# Patient Record
Sex: Male | Born: 1935 | Race: White | Hispanic: No | Marital: Married | Smoking: Former smoker
Health system: Southern US, Community
[De-identification: ages and names within clinical notes are randomized; demographics above are authoritative.]

## PROBLEM LIST (undated history)

## (undated) DIAGNOSIS — I2510 Atherosclerotic heart disease of native coronary artery without angina pectoris: Secondary | ICD-10-CM

## (undated) DIAGNOSIS — J45909 Unspecified asthma, uncomplicated: Secondary | ICD-10-CM

## (undated) DIAGNOSIS — I10 Essential (primary) hypertension: Secondary | ICD-10-CM

## (undated) DIAGNOSIS — N189 Chronic kidney disease, unspecified: Secondary | ICD-10-CM

## (undated) DIAGNOSIS — I4891 Unspecified atrial fibrillation: Secondary | ICD-10-CM

## (undated) DIAGNOSIS — C801 Malignant (primary) neoplasm, unspecified: Secondary | ICD-10-CM

## (undated) DIAGNOSIS — I509 Heart failure, unspecified: Secondary | ICD-10-CM

## (undated) DIAGNOSIS — J449 Chronic obstructive pulmonary disease, unspecified: Secondary | ICD-10-CM

## (undated) DIAGNOSIS — I442 Atrioventricular block, complete: Secondary | ICD-10-CM

## (undated) DIAGNOSIS — I350 Nonrheumatic aortic (valve) stenosis: Secondary | ICD-10-CM

## (undated) DIAGNOSIS — M109 Gout, unspecified: Secondary | ICD-10-CM

## (undated) DIAGNOSIS — Z952 Presence of prosthetic heart valve: Secondary | ICD-10-CM

## (undated) DIAGNOSIS — E7800 Pure hypercholesterolemia, unspecified: Secondary | ICD-10-CM

## (undated) HISTORY — PX: AORTIC VALVE SURGERY: SHX549

## (undated) HISTORY — PX: OTHER SURGICAL HISTORY: SHX169

## (undated) HISTORY — PX: PACEMAKER INSERTION: SHX728

## (undated) HISTORY — PX: CORONARY ARTERY BYPASS GRAFT: SHX141

---

## 2003-10-25 ENCOUNTER — Other Ambulatory Visit: Payer: Self-pay

## 2004-03-16 ENCOUNTER — Other Ambulatory Visit: Payer: Self-pay

## 2004-03-16 ENCOUNTER — Emergency Department: Payer: Self-pay | Admitting: Emergency Medicine

## 2004-03-20 ENCOUNTER — Other Ambulatory Visit: Payer: Self-pay

## 2004-03-20 ENCOUNTER — Emergency Department: Payer: Self-pay | Admitting: Emergency Medicine

## 2008-02-24 ENCOUNTER — Inpatient Hospital Stay: Payer: Self-pay | Admitting: Cardiology

## 2008-05-04 ENCOUNTER — Inpatient Hospital Stay: Payer: Self-pay | Admitting: Internal Medicine

## 2008-07-01 ENCOUNTER — Encounter: Payer: Self-pay | Admitting: Cardiology

## 2008-07-22 ENCOUNTER — Encounter: Payer: Self-pay | Admitting: Cardiology

## 2008-11-19 ENCOUNTER — Ambulatory Visit: Payer: Self-pay | Admitting: Cardiology

## 2009-04-10 ENCOUNTER — Emergency Department: Payer: Self-pay | Admitting: Emergency Medicine

## 2010-11-02 ENCOUNTER — Ambulatory Visit: Payer: Self-pay | Admitting: Urology

## 2010-11-08 ENCOUNTER — Ambulatory Visit: Payer: Self-pay | Admitting: Urology

## 2010-11-09 LAB — PATHOLOGY REPORT

## 2013-02-06 LAB — CBC WITH DIFFERENTIAL/PLATELET
Basophil #: 0 10*3/uL (ref 0.0–0.1)
Basophil %: 0.3 %
Eosinophil %: 4 %
HCT: 35.1 % — ABNORMAL LOW (ref 40.0–52.0)
Lymphocyte #: 0.9 10*3/uL — ABNORMAL LOW (ref 1.0–3.6)
Lymphocyte %: 9.3 %
Monocyte #: 0.8 x10 3/mm (ref 0.2–1.0)
Monocyte %: 8.4 %
Neutrophil %: 78 %

## 2013-02-06 LAB — BASIC METABOLIC PANEL
Calcium, Total: 8.1 mg/dL — ABNORMAL LOW (ref 8.5–10.1)
Co2: 24 mmol/L (ref 21–32)
EGFR (Non-African Amer.): 33 — ABNORMAL LOW
Potassium: 4 mmol/L (ref 3.5–5.1)

## 2013-02-06 LAB — TROPONIN I: Troponin-I: <0.02 ng/mL

## 2013-02-07 ENCOUNTER — Inpatient Hospital Stay: Payer: Self-pay | Admitting: Internal Medicine

## 2013-02-07 DIAGNOSIS — R55 Syncope and collapse: Secondary | ICD-10-CM

## 2013-02-07 LAB — URINALYSIS, COMPLETE
Blood: NEGATIVE
Glucose,UR: NEGATIVE mg/dL (ref 0–75)
Ketone: NEGATIVE
Leukocyte Esterase: NEGATIVE
Nitrite: NEGATIVE
Ph: 5 (ref 4.5–8.0)
RBC,UR: 1 /HPF (ref 0–5)
Specific Gravity: 1.018 (ref 1.003–1.030)
Squamous Epithelial: <1

## 2013-02-07 LAB — BASIC METABOLIC PANEL
Anion Gap: 7 (ref 7–16)
BUN: 26 mg/dL — ABNORMAL HIGH (ref 7–18)
Calcium, Total: 7.6 mg/dL — ABNORMAL LOW (ref 8.5–10.1)
Chloride: 109 mmol/L — ABNORMAL HIGH (ref 98–107)
Co2: 24 mmol/L (ref 21–32)
Creatinine: 1.58 mg/dL — ABNORMAL HIGH (ref 0.60–1.30)
EGFR (African American): 43 — ABNORMAL LOW
Glucose: 139 mg/dL — ABNORMAL HIGH (ref 65–99)
Osmolality: 286 (ref 275–301)
Potassium: 4 mmol/L (ref 3.5–5.1)

## 2013-02-07 LAB — CK TOTAL AND CKMB (NOT AT ARMC)
CK, Total: 61 U/L (ref 35–232)
CK-MB: 1.3 ng/mL (ref 0.5–3.6)

## 2013-02-07 LAB — PLATELET COUNT: Platelet: 115 10*3/uL — ABNORMAL LOW (ref 150–440)

## 2013-06-04 ENCOUNTER — Encounter: Payer: Self-pay | Admitting: Cardiology

## 2013-06-21 ENCOUNTER — Encounter: Payer: Self-pay | Admitting: Cardiology

## 2013-07-22 ENCOUNTER — Encounter: Payer: Self-pay | Admitting: Cardiology

## 2013-08-21 ENCOUNTER — Encounter: Payer: Self-pay | Admitting: Cardiology

## 2013-10-18 ENCOUNTER — Emergency Department: Payer: Self-pay | Admitting: Emergency Medicine

## 2013-10-18 LAB — BASIC METABOLIC PANEL
Anion Gap: 6 — ABNORMAL LOW (ref 7–16)
BUN: 24 mg/dL — AB (ref 7–18)
Calcium, Total: 8.9 mg/dL (ref 8.5–10.1)
Chloride: 107 mmol/L (ref 98–107)
Co2: 27 mmol/L (ref 21–32)
Creatinine: 1.28 mg/dL (ref 0.60–1.30)
GFR CALC AF AMER: 56 — AB
GFR CALC NON AF AMER: 48 — AB
Glucose: 118 mg/dL — ABNORMAL HIGH (ref 65–99)
OSMOLALITY: 285 (ref 275–301)
Potassium: 3.9 mmol/L (ref 3.5–5.1)
SODIUM: 140 mmol/L (ref 136–145)

## 2013-10-18 LAB — CBC
HCT: 45.1 % (ref 40.0–52.0)
HGB: 15.1 g/dL (ref 13.0–18.0)
MCH: 30.2 pg (ref 26.0–34.0)
MCHC: 33.5 g/dL (ref 32.0–36.0)
MCV: 90 fL (ref 80–100)
Platelet: 151 10*3/uL (ref 150–440)
RBC: 4.99 10*6/uL (ref 4.40–5.90)
RDW: 15.5 % — AB (ref 11.5–14.5)
WBC: 7.5 10*3/uL (ref 3.8–10.6)

## 2013-10-18 LAB — TROPONIN I

## 2014-08-13 NOTE — Consult Note (Signed)
PATIENT NAME:  Larry Alexander, Larry Alexander MR#:  [id number] DATE OF BIRTH:  1918-04-04  CARDIOLOGY CONSULTATION   DATE OF CONSULTATION:  02/07/2013  CONSULTING PHYSICIAN:  Isaias Cowman, MD  PRIMARY CARE PHYSICIAN: Youlanda Roys. Lovie Macadamia, MD  CARDIOLOGIST: Javier Docker. Ubaldo Glassing, MD  CHIEF COMPLAINT: I passed out.   HISTORY OF PRESENT ILLNESS: The patient is a 81 year old gentleman with known coronary artery disease, status post bypass graft surgery, and severe aortic stenosis, who was admitted following a syncopal episode. The patient recently underwent cardiac catheterization at [facility] which revealed patent bypass grafts and severe aortic stenosis. The patient is being discussed for potential CoreValve TAVR procedure at [facility]. The patient was at home last night when he slumped in the shower, found by his son. EMS was called. The patient was admitted to telemetry, where he has had intermittent episodes of high-grade AV block, the longest 10 seconds. The patient has remained hemodynamically stable following the hospitalization. He currently denies chest pain or shortness of breath.   PAST MEDICAL HISTORY:  1. Non-ST elevation myocardial infarction in 2010 with eventual bypass graft surgery at [facility].  2. Severe aortic stenosis.  3. Hypertension.  4. Hyperlipidemia.  5. Chronic kidney disease.   MEDICATIONS ON ADMISSION:  1. Metoprolol succinate 12.5 mg daily.  2. Losartan 100 mg daily. 3. Furosemide 20 mg daily.  4. Aspirin 81 mg daily.  5. Amlodipine 5 mg daily.  6. Tamsulosin 0.4 mg capsule daily.  7. Albuterol inhaler 2 puffs q.6 p.r.n.   SOCIAL HISTORY: The patient currently lives with his son and daughter-in-law. He quit tobacco abuse in 1985.   FAMILY HISTORY: No immediate family history of myocardial infarction or coronary artery disease.   REVIEW OF SYSTEMS:  CONSTITUTIONAL: No fever or chills.  EYES: No blurry vision.  EARS: No hearing loss.  RESPIRATORY: No shortness of breath.   CARDIOVASCULAR: No chest pain. Syncope as described above.  GASTROINTESTINAL: No nausea, vomiting or diarrhea.  GENITOURINARY: No dysuria or hematuria.  ENDOCRINE: No polyuria or polydipsia.  MUSCULOSKELETAL: No arthralgias or myalgias.  NEUROLOGICAL: The patient is alert and oriented x3. Motor and sensory both grossly intact.   PHYSICAL EXAMINATION:  VITAL SIGNS: Blood pressure 112/57, pulse 75, respirations 18, temperature 97.6, pulse oximetry 95%.  HEENT: Pupils equally reactive to light and accommodation.  NECK: Supple without thyromegaly.  LUNGS: Clear.  CARDIOVASCULAR: Normal JVP. Normal PMI. Regular rate and rhythm. Normal S1, S2. Grade 2/6 crescendo-decrescendo murmur.  ABDOMEN: Soft and nontender without hepatosplenomegaly.  EXTREMITIES: No cyanosis, clubbing or edema. Pulses were intact bilaterally.  MUSCULOSKELETAL: Normal muscle tone.  NEUROLOGIC: The patient is alert and oriented x3. Motor and sensory both grossly intact.   IMPRESSION: A 81 year old gentleman with known coronary artery disease, apparently with patent bypass grafts, with severe aortic stenosis, being considered for CoreValve at [facility], who presents with syncope, found to have episodes of high-grade AV block, likely the etiology of his syncope on only low-dose metoprolol.   RECOMMENDATIONS:  1. Agree with current therapy.  2. Discussed with Dr. Jamse Arn at [facility]. Will transfer for permanent pacemaker implantation and will likely accelerate CoreValve procedure.   ____________________________ Isaias Cowman, MD ap:lb D: 02/07/2013 10:51:05 ET T: 02/07/2013 11:17:13 ET JOB#: [id number]  cc: Isaias Cowman, MD, <Dictator> Isaias Cowman MD ELECTRONICALLY SIGNED 03/11/2013 9:29

## 2014-08-13 NOTE — H&P (Signed)
PATIENT NAME:  Larry Alexander, GOWIN MR#:  [id number] DATE OF BIRTH:  1917/12/13  DATE OF ADMISSION:  02/07/2013  REFERRING PHYSICIAN:  Dr. Mariea Clonts.  PRIMARY CARE PHYSICIAN:  Dr. Juluis Pitch.   CHIEF COMPLAINT: Syncope.  HISTORY OF PRESENT ILLNESS: The patient is a 81 year old Caucasian male with past medical history of hypertension, hyperlipidemia, aortic valve stenosis, chronic kidney disease, benign prostatic hypertrophy who was brought into the ER by his son. The patient was complaining of dizziness at noon yesterday and while taking a shower he was syncopized in the evening. According to the son, the patient was sitting in the shower and completely lost his consciousness. No traumas or injuries were noticed. According to the son, the patient might have passed out at least for 5 minutes. The patient was brought out of the shower by the son. He was laid on the bed and EMS was called. The patient sees two cardiologists and  he was seen by him just a 3 to 4 days ago. The patient was diagnosed with severe aortic stenosis and he is getting evaluated by a cardiothoracic surgeon regarding possible surgery considering his elderly age. The patient had a CAT scan of the chest done on 10/15 and a cardiac catheter was done on 10/14 which was normal without any blockages as reported by the son. The patient had regained his consciousness in approximately 5 minutes and brought into the ER. As the syncope seemed to be from severe aortic valve stenosis, a CAT scan of the head was not done. The patient denies any chest pain or shortness of breath.  The hospitalist team was called to admit the patient. The patient sees Dr. Ubaldo Glassing also as an outpatient. The patient denies any chest pain, shortness of breath, orthopnea, or paroxysmal nocturnal dyspnea.   PAST MEDICAL HISTORY: Coronary artery disease with a NSTEMI in the year 2010, hyperlipidemia, benign prostatic hypertrophy, hypertension, chronic kidney disease.   PAST SURGICAL  HISTORY: Recent history of cardiac catheter which was normal. This was done on 10/14 at [facility] cardiology.   ALLERGIES:  No known drug allergies.   PSYCHOSOCIAL HISTORY: Lives at home. Son and daughter-in-law live with him. He quit smoking in 1985. No history of alcohol or illicit drug usage.   FAMILY HISTORY: Positive for hypertension, congestive heart failure in his dad.   HOME MEDICATION:  Tamsulosin 0.4 mg 1 capsule p.o. daily, metoprolol succinate 12.5 mg once daily, meclizine 20 mg 2 times a day, losartan 100 mg once daily, furosemide 20 mg 1 tablet once daily, finasteride 5 mg once daily, atorvastatin 20 mg 1 tablet once daily, aspirin 81 mg once daily, amlodipine 5 mg once daily, albuterol 2 puffs inhalation every 6 hours as needed.  REVIEW OF SYSTEMS: CONSTITUTIONAL: Denies any fever or fatigue.  EYES: Denies blurry vision.  EARS, NOSE, THROAT: No epistaxis or discharge.   RESPIRATION: Denies cough or COPD.  CARDIOVASCULAR: No chest pain or palpation, but complaining of dizziness.  GASTROINTESTINAL:  Denies nausea, vomiting, diarrhea.  GENITOURINARY: No dysuria or hematuria.  ENDOCRINE: Denies polyuria, nocturia, thyroid problems.   HEMATOLOGIC AND LYMPHATIC:  No anemia, easy bruising or bleeding.  INTEGUMENT:  No acne, rash or lesions.  MUSCULOSKELETAL: No joint pain in the neck and back. Denies gout.  NEUROLOGIC: No vertigo, ataxia. The patient is hard of hearing.  PSYCHIATRIC: Denies ADD or OCD  PHYSICAL EXAMINATION:   VITAL SIGNS:  Temperature 97.4, pulse 71, respirations 20, blood pressure 101/54, pulse ox 98%. GENERAL:  The patient  is not in acute distress. Moderately built and nourished.  HEENT: Normocephalic, atraumatic. Pupils are equally reacting to light and accommodation. No scleral icterus. No conjunctival injection. No sinus tenderness. Dry mucous membranes.  NECK: Supple. No JVD. No thyromegaly.  LUNGS: Clear to auscultation bilaterally. No accessory muscle usage.  No anterior chest wall tenderness. No peripheral edema.  CARDIAC: S1, S2 normal. Regular rate and rhythm. Positive ejection systolic murmur, probably from severe aortic valve stenosis.   GASTROINTESTINAL:  Soft. Bowel sounds are positive in all 4 quadrants. Nontender, nondistended. No hepatosplenomegaly.  NEUROLOGIC:  Awake, alert, oriented x 2.  EXTREMITIES: No edema. No cyanosis. No clubbing. No peripheral edema.  MUSCULOSKELETAL: No joint effusion, tenderness, erythema. VASCULAR:  1+ dorsalis pedis and posterior tibialis pulses.   LABORATORY AND IMAGING STUDIES: CK total is 58, CPK-MB 1.2, troponin less than 0.02. WBC 9.9, hemoglobin is 12.3, hematocrit 35.1, platelets 124. Urinalysis yellow in color, clear in appearance, leukocyte esterase and nitrites are negative. Glucose 139, BUN 27, creatinine 1.75, sodium 139, potassium 4.0, chloride 106, CO2 24, anion gap 9. Serum osmolality 285, calcium 8.1.  12 lead EKG: Normal sinus rhythm with nonspecific ST-T wave changes.   ASSESSMENT AND PLAN: A 81 year old Caucasian male brought into the ER after he was syncopized at home for approximately 5 minutes.  He will be admitted with following assessment and plan.  1.  Syncope, probably from cardiogenic with a severe aortic valve stenosis and dehydration. The patient will be admitted to telemetry, cardiology consult is placed with Dr. Ubaldo Glassing in view of severe aortic valve stenosis. Cycle cardiac biomarkers. We will provide him IV fluids in view of dehydration.  We will obtain echocardiogram and carotid Doppler. CT head is not done as the  syncope seemed to be from cardiogenic syncope as well as dehydration. Recent cardiac catheter done on 10/14 was normal. We will check orthostatics. We will provide him aspirin and statin.  Hold hypertension medicines in view of low blood pressure.  2.  Dehydration. Will provide IV fluids.  3.  Chronic renal insufficiency.  We will monitor renal function closely and gentle  hydration with IV fluids in view of dehydration.  4.  Benign prostatic hypertrophy. Resume his home medication.  5.  Hyperlipidemia. Continue statin.  6.  Hypertension. In view of low blood pressure, we will hold off on the blood pressure medications.  7.  We will provide him gastrointestinal and deep vein thrombosis prophylaxis.  8.  He is FULL CODE. Power of attorney is his 2 sons.   Diagnoses and plan of care were discussed in detail with the patient and his son at bedside. They verbalized understanding of the plan.   Total time spent on admission is 45 minutes.   ____________________________ Nicholes Mango, MD ag:dp D: 02/07/2013 08:32:29 ET T: 02/07/2013 10:00:00 ET JOB#: [id number]  cc: Nicholes Mango, MD, <Dictator> Nicholes Mango MD ELECTRONICALLY SIGNED 02/20/2013 1:48

## 2014-08-13 NOTE — Discharge Summary (Signed)
PATIENT NAME:  Larry Alexander, PENTECOST MR#:  [id number] DATE OF BIRTH:  11-Nov-1917  DATE OF ADMISSION:  02/07/2013 DATE OF DISCHARGE:  02/07/2013  ADMITTING DIAGNOSIS: Syncope.  DISCHARGE DIAGNOSES:   1.  Syncope with high-degree atrioventricular block.   2.  Hypotension.  3.  Rash, likely drug eruption, suspected Lasix versus intravenous dye. 4.  History of aortic stenosis. 5.  Coronary artery disease, status post coronary artery bypass grafting. 6.  Chronic kidney disease. 7.  Hyperlipidemia. 8.  Benign prostatic hypertrophy. 9.  Chronic obstructive pulmonary disease. 10.  Possible suspected hemodynamically significant right internal carotid stenosis, possible 60% in the right carotid bulb, as well as borderline increased velocities in the left internal carotid.   CONSULTANTS: Dr. Saralyn Pilar.   RADIOLOGIC STUDIES: Chest PA and lateral, 02/06/2013, showed COPD with coronary artery bypass grafting changes, no acute abnormalities. CT scan of head without contrast, 02/07/2013, showed chronic changes, no acute intracranial abnormalities. Carotid ultrasound/bilateral Doppler ultrasound, 02/07/2013, revealed atherosclerotic disease. This appears to be possibly hemodynamically significant on the right with borderline increased velocities in the left internal carotid. Vascular interventional surgical consultation was recommended.   The patient does a 81 year old Caucasian male with past medical history significant for history of coronary artery disease, who presented to the hospital on 02/07/2013, just recently discharged from [facility] where he had cardiac catheterization as well as CT scan of the heart done and was evaluated for aortic valve stenosis repair via CoreValve TAVR procedure. Presented to the hospital with complaints of syncopal episode. Please refer to Dr. Rinaldo Ratel admission note dictated on 02/07/2013. On arrival to the hospital, the patient was noted to be somewhat hypotensive  with systolic blood pressure in the 100s range. Otherwise, his oxygen saturation, temperature, as well as pulse were within normal limits. The patient was just recently reinitiated back on Norvasc as well as diuretic/Lasix. He was admitted to the hospital for further evaluation and while in [facility], he was noted to have intermittent episodes of high-grade AV block. Consultation with Dr. Saralyn Pilar was obtained. Dr. Saralyn Pilar felt that the patient's syncopal episode was very likely due to high-grade AV block, etiology of which was very likely due to low-dose metoprolol. Dr. Saralyn Pilar discussed the patient's case with Dr. Jamse Arn at [facility] and the patient is being transferred for permanent pacemaker implantation, which will also likely accelerate CoreValve procedure.  The patient's vital signs on the day of discharge: Temperature was 97.4. Pulse was 79. Respiratory rate was 18, blood pressure 138/64. Saturation was 96% on 2 liters of oxygen as well as on room air. While in the hospital, the patient was noted to have a rash in his lower extremities as well as his flanks, which was concerning for drug eruption, and since the patient was initiated on new medication of Lasix, it was felt that Lasix could have been implicated; however, we could not rule out IV dye. For this reason IV dye administration as well as Lasix administration should be cautious. The patient was given Solu-Medrol while in the hospital. He was also given prescription for prednisone tapering doses.   DISCHARGE MEDICATIONS: Albuterol CFC-free 2 puffs every 6 hours as needed, atorvastatin 20 mg p.o. at bedtime, finasteride 5 mg p.o. daily, losartan 100 mg daily, (Dictation Anomaly) meclizine 25 mg twice daily as needed, omeprazole 10 mg once daily as needed, tamsulosin 0.4 mg once daily, prednisone tapering doses 50 mg p.o. once on 02/08/2013 and then taper by 10 mg daily  until stopped.  As a work-up for  syncope, the patient had carotid ultrasound testing, which showed right ICA stenosis. Vascular consultation was recommended. However, as the patient was transferred to [facility] for further evaluation, vascular consultation was recommended to be to be performed at [facility]. In regards to aortic stenosis, CKD, hyperlipidemia, BPH as well as COPD, the patient is to continue his outpatient management. The patient is being discharged in stable condition with the above-mentioned medications and followup.   TIME SPENT: 40 minutes.   ____________________________ Theodoro Grist, MD rv:jm D: 02/07/2013 18:13:58 ET T: 02/07/2013 18:53:54 ET JOB#: [id number]  cc: Theodoro Grist, MD, <Dictator> Chequita Mofield MD ELECTRONICALLY SIGNED 02/21/2013 21:45

## 2014-11-17 IMAGING — CT CT HEAD WITHOUT CONTRAST
1 series · 16 of 30 positions shown, 20 images · non-contrast
Comparison: none

REASON FOR EXAM: syncope
COMMENTS:

[Series 2: soft tissue · axial · 0.44mm/px · z∈[+1158,+1298]mm · 16 of 32 slices shown, 20 images]
[im 2/32  brain]
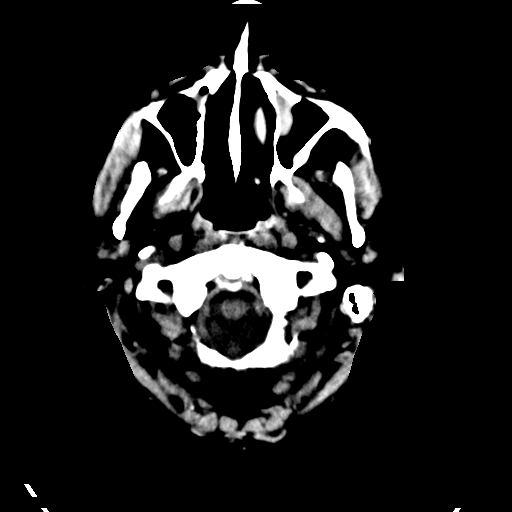
[im 2/32  bone]
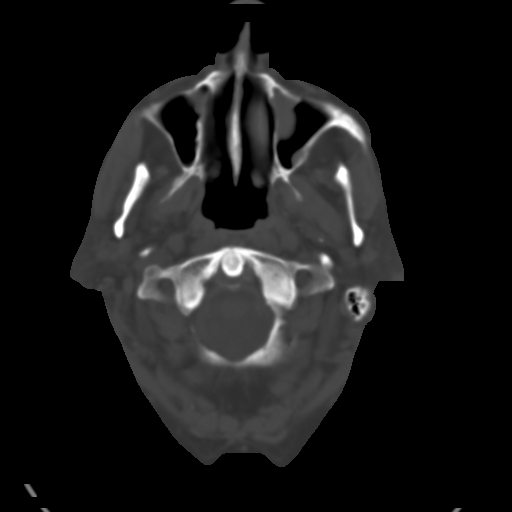
[im 4/32  brain]
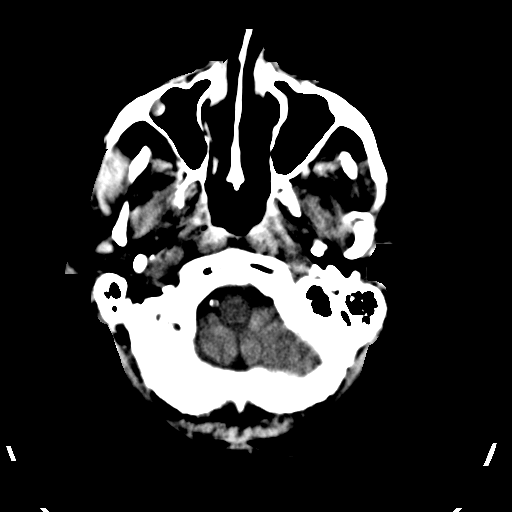
[im 6/32  brain]
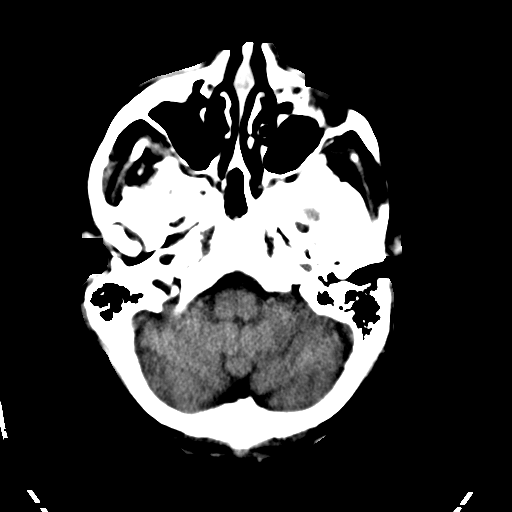
[im 8/32  brain]
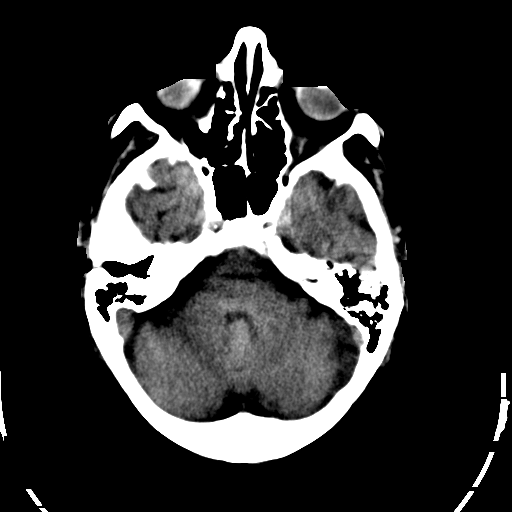
[im 9/32  brain]
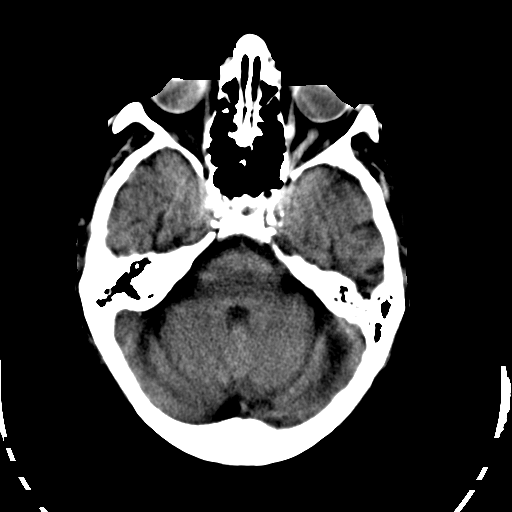
[im 9/32  bone]
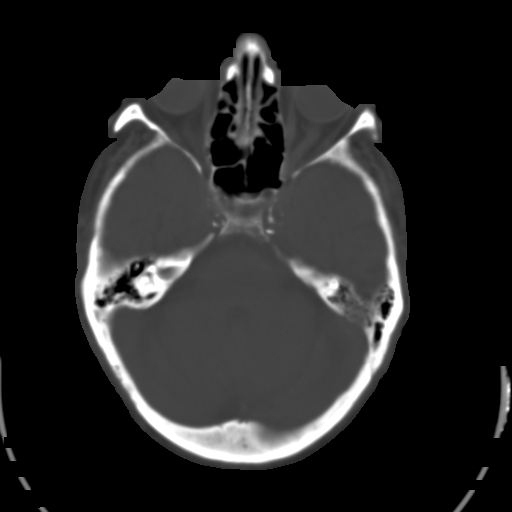
[im 11/32  brain]
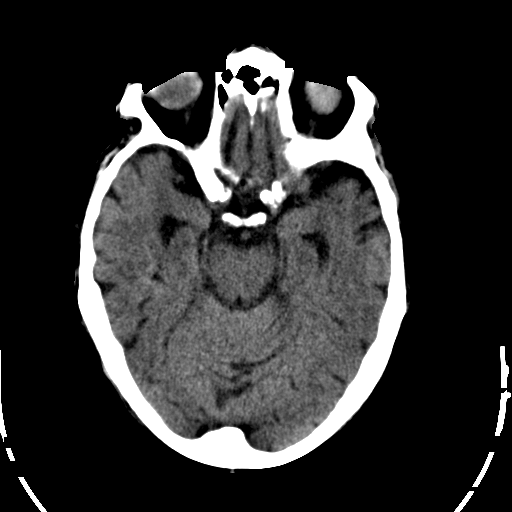
[im 13/32  brain]
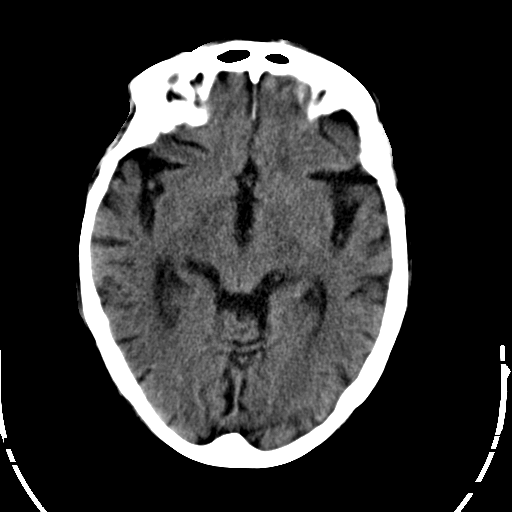
[im 15/32  brain]
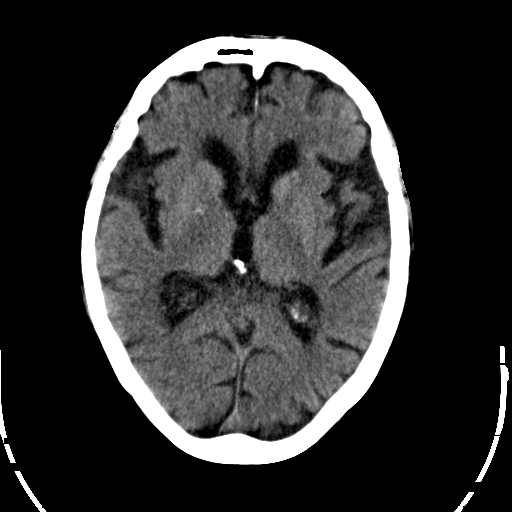
[im 17/32  brain]
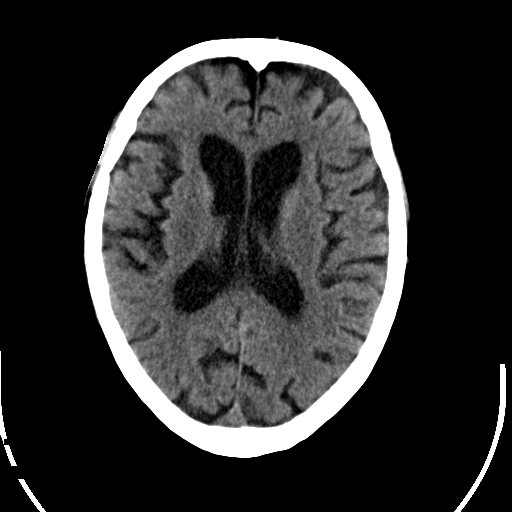
[im 17/32  bone]
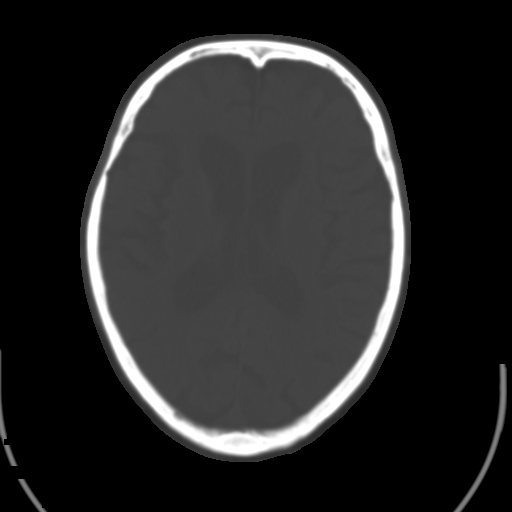
[im 19/32  brain]
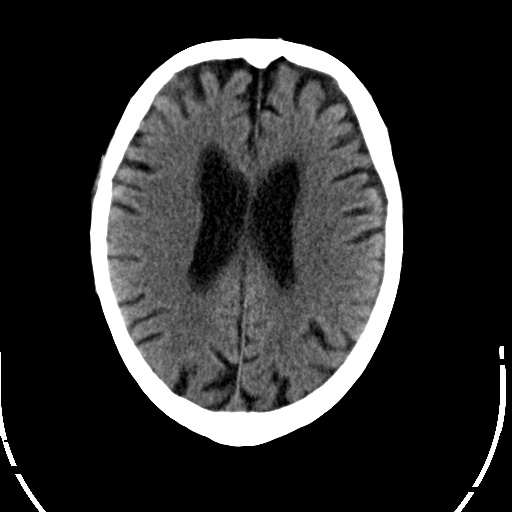
[im 21/32  brain]
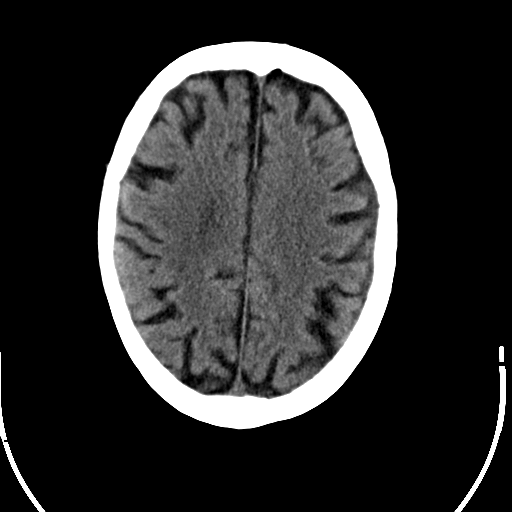
[im 23/32  brain]
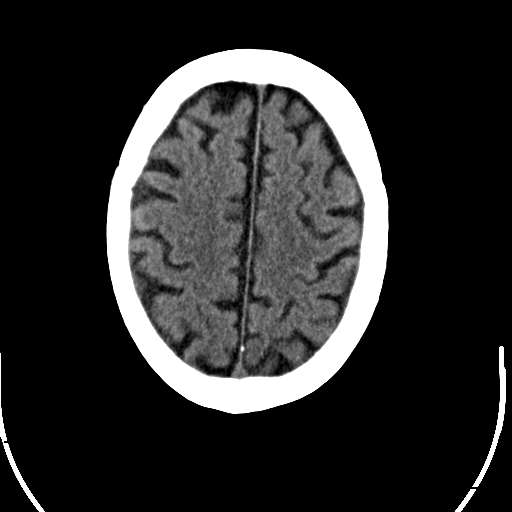
[im 24/32  brain]
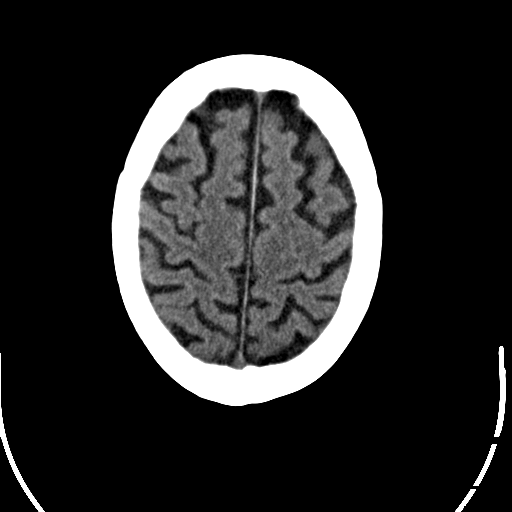
[im 24/32  bone]
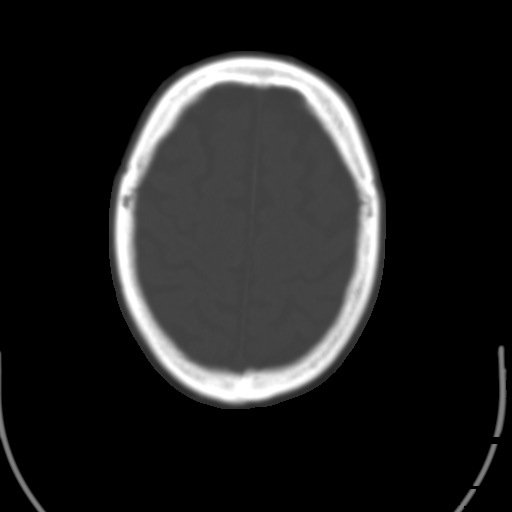
[im 26/32  brain]
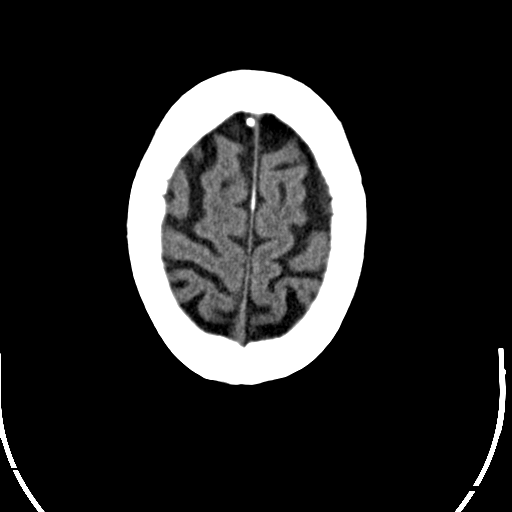
[im 28/32  brain]
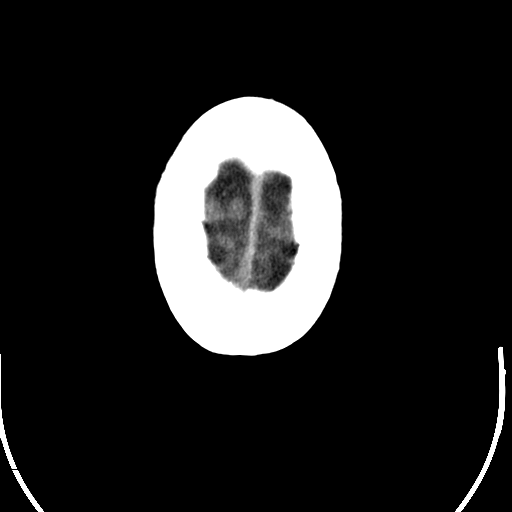
[im 30/32  brain]
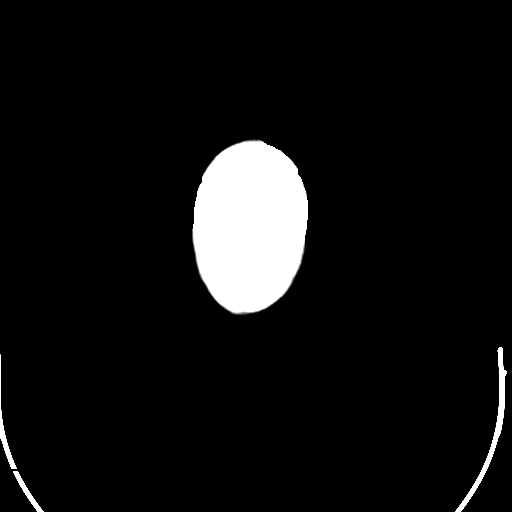

[16 of 30 positions shown; findings below may reference images not displayed]

PROCEDURE:     CT  - CT HEAD WITHOUT CONTRAST  - February 07, 2013 [DATE]

RESULT:     Noncontrast CT shows prominence of the ventricles and sulci.
There is low-attenuation in the periventricular and subcortical white
matter. There is no evidence of intracranial hemorrhage, mass or mass
effect. There is a mucous retention cyst in the floor of the left maxillary
sinus with a smaller similar structure in the anterior lateral mid right
maxillary sinus. The mastoid air cells are unremarkable. The calvarium is
intact.
IMPRESSION: Chronic changes as described. No acute intracranial
abnormality evident.

[REDACTED]

## 2016-05-07 ENCOUNTER — Emergency Department
Admission: EM | Admit: 2016-05-07 | Discharge: 2016-05-08 | Disposition: A | Discharge status: Home / Self Care | Payer: Medicare Other | Attending: Emergency Medicine | Admitting: Emergency Medicine

## 2016-05-07 ENCOUNTER — Encounter: Payer: Self-pay | Admitting: Emergency Medicine

## 2016-05-07 DIAGNOSIS — J449 Chronic obstructive pulmonary disease, unspecified: Secondary | ICD-10-CM | POA: Diagnosis not present

## 2016-05-07 DIAGNOSIS — Z950 Presence of cardiac pacemaker: Secondary | ICD-10-CM | POA: Diagnosis not present

## 2016-05-07 DIAGNOSIS — Z951 Presence of aortocoronary bypass graft: Secondary | ICD-10-CM | POA: Insufficient documentation

## 2016-05-07 DIAGNOSIS — I509 Heart failure, unspecified: Secondary | ICD-10-CM | POA: Insufficient documentation

## 2016-05-07 DIAGNOSIS — N189 Chronic kidney disease, unspecified: Secondary | ICD-10-CM | POA: Diagnosis not present

## 2016-05-07 DIAGNOSIS — E860 Dehydration: Secondary | ICD-10-CM | POA: Diagnosis not present

## 2016-05-07 DIAGNOSIS — I25810 Atherosclerosis of coronary artery bypass graft(s) without angina pectoris: Secondary | ICD-10-CM | POA: Diagnosis not present

## 2016-05-07 DIAGNOSIS — J101 Influenza due to other identified influenza virus with other respiratory manifestations: Secondary | ICD-10-CM | POA: Insufficient documentation

## 2016-05-07 DIAGNOSIS — R197 Diarrhea, unspecified: Secondary | ICD-10-CM

## 2016-05-07 DIAGNOSIS — R112 Nausea with vomiting, unspecified: Secondary | ICD-10-CM | POA: Diagnosis present

## 2016-05-07 DIAGNOSIS — I130 Hypertensive heart and chronic kidney disease with heart failure and stage 1 through stage 4 chronic kidney disease, or unspecified chronic kidney disease: Secondary | ICD-10-CM | POA: Diagnosis not present

## 2016-05-07 DIAGNOSIS — I959 Hypotension, unspecified: Secondary | ICD-10-CM | POA: Diagnosis not present

## 2016-05-07 HISTORY — DX: Presence of prosthetic heart valve: Z95.2

## 2016-05-07 HISTORY — DX: Heart failure, unspecified: I50.9

## 2016-05-07 HISTORY — DX: Chronic obstructive pulmonary disease, unspecified: J44.9

## 2016-05-07 HISTORY — DX: Nonrheumatic aortic (valve) stenosis: I35.0

## 2016-05-07 HISTORY — DX: Essential (primary) hypertension: I10

## 2016-05-07 HISTORY — DX: Unspecified atrial fibrillation: I48.91

## 2016-05-07 HISTORY — DX: Gout, unspecified: M10.9

## 2016-05-07 HISTORY — DX: Chronic kidney disease, unspecified: N18.9

## 2016-05-07 HISTORY — DX: Atherosclerotic heart disease of native coronary artery without angina pectoris: I25.10

## 2016-05-07 HISTORY — DX: Pure hypercholesterolemia, unspecified: E78.00

## 2016-05-07 HISTORY — DX: Atrioventricular block, complete: I44.2

## 2016-05-07 LAB — URINALYSIS, COMPLETE (UACMP) WITH MICROSCOPIC
BACTERIA UA: NONE SEEN
Bilirubin Urine: NEGATIVE
Glucose, UA: NEGATIVE mg/dL
Hgb urine dipstick: NEGATIVE
Ketones, ur: NEGATIVE mg/dL
Leukocytes, UA: NEGATIVE
Nitrite: NEGATIVE
PROTEIN: 30 mg/dL — AB
Specific Gravity, Urine: 1.024 (ref 1.005–1.030)
pH: 5 (ref 5.0–8.0)

## 2016-05-07 LAB — CBC
HCT: 47.6 % (ref 40.0–52.0)
HEMOGLOBIN: 16.1 g/dL (ref 13.0–18.0)
MCH: 30.9 pg (ref 26.0–34.0)
MCHC: 33.7 g/dL (ref 32.0–36.0)
MCV: 91.4 fL (ref 80.0–100.0)
Platelets: 138 10*3/uL — ABNORMAL LOW (ref 150–440)
RBC: 5.2 MIL/uL (ref 4.40–5.90)
RDW: 14.7 % — ABNORMAL HIGH (ref 11.5–14.5)
WBC: 7.9 10*3/uL (ref 3.8–10.6)

## 2016-05-07 LAB — INFLUENZA PANEL BY PCR (TYPE A & B)
INFLAPCR: NEGATIVE
INFLBPCR: POSITIVE — AB

## 2016-05-07 LAB — COMPREHENSIVE METABOLIC PANEL
ALK PHOS: 75 U/L (ref 38–126)
ALT: 17 U/L (ref 17–63)
ANION GAP: 11 (ref 5–15)
AST: 38 U/L (ref 15–41)
Albumin: 3.5 g/dL (ref 3.5–5.0)
BILIRUBIN TOTAL: 0.7 mg/dL (ref 0.3–1.2)
BUN: 26 mg/dL — ABNORMAL HIGH (ref 6–20)
CALCIUM: 9.3 mg/dL (ref 8.9–10.3)
CO2: 22 mmol/L (ref 22–32)
Chloride: 103 mmol/L (ref 101–111)
Creatinine, Ser: 1.01 mg/dL (ref 0.61–1.24)
GLUCOSE: 123 mg/dL — AB (ref 65–99)
Potassium: 3.8 mmol/L (ref 3.5–5.1)
Sodium: 136 mmol/L (ref 135–145)
TOTAL PROTEIN: 7.5 g/dL (ref 6.5–8.1)

## 2016-05-07 LAB — TROPONIN I
TROPONIN I: 0.04 ng/mL — AB (ref <0.03)
Troponin I: 0.03 ng/mL (ref <0.03)

## 2016-05-07 MED ORDER — ONDANSETRON HCL 4 MG/2ML IJ SOLN
4.0000 mg | Freq: Once | INTRAMUSCULAR | Status: AC
  Administered 2016-05-07: 4 mg via INTRAVENOUS
  Filled 2016-05-07: qty 2

## 2016-05-07 MED ORDER — SODIUM CHLORIDE 0.9 % IV BOLUS (SEPSIS)
1000.0000 mL | Freq: Once | INTRAVENOUS | Status: AC
  Administered 2016-05-07: 1000 mL via INTRAVENOUS

## 2016-05-07 NOTE — ED Notes (Signed)
Pt presently taking doxycycline BID for skin tear on L hand secondary to fall and break in Oct.

## 2016-05-07 NOTE — ED Provider Notes (Signed)
[facility] Emergency Department Provider Note  ____________________________________________   First MD Initiated Contact with Patient 05/07/16 2032     (approximate)  I have reviewed the triage vital signs and the nursing notes.   HISTORY  Chief Complaint Dehydration   HPI Larry Alexander is a 83 y.o. male with a history of aortic stenosis as well as atrial fibrillation on aspirin who is presenting to the emergency department with 3-4 days of weakness, body aches as well as one episode of vomiting and 4 episodes of diarrhea today. There is no report of fever. The patient has been on doxycycline for about 6-1/2 days at this time for prophylaxis from a skin tear to his left arm. He also suffered a scaphoid fracture. This was from a fall. The patient is denying any pain at this time. His son took the patient's blood pressure at home and found his systolic blood pressure to be 76. However, in the emergency department and has been up in the 90s to 100s. Son also says that his father, the patient, has been very weak over the past several days.   Past Medical History:  Diagnosis Date  . Acute gout   . Aortic stenosis   . Atrial fibrillation ([place])   . CHF (congestive heart failure) ([place])   . Chronic kidney disease (CKD)   . COPD (chronic obstructive pulmonary disease) ([place])   . Coronary artery disease   . High cholesterol   . Hypertension   . 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 S/P TAVR (transcatheter aortic valve replacement)     There are no active problems to display for this patient.   Past Surgical History:  Procedure Laterality Date  . AORTIC VALVE SURGERY    . CORONARY ARTERY BYPASS GRAFT    . PACEMAKER INSERTION    . THROMBOENDARTERECTOMY FEMORAL ARTERY    . triple bypass      Prior to Admission medications   Not on File    Allergies Contrast media [iodinated diagnostic agents]  No family history on file.  Social History Social  History  Substance Use Topics  . Smoking status: Not on file  . Smokeless tobacco: Not on file  . Alcohol use Not on file    Review of Systems Constitutional: No fever/chills Eyes: No visual changes. ENT: No sore throat. Cardiovascular: Denies chest pain. Respiratory: Denies shortness of breath. Gastrointestinal: No abdominal pain.   No constipation. Genitourinary: Negative for dysuria. Musculoskeletal: Negative for back pain. Skin: Negative for rash. Neurological: Negative for headaches, focal weakness or numbness.  10-point ROS otherwise negative.  ____________________________________________   PHYSICAL EXAM:  VITAL SIGNS: ED Triage Vitals  Enc Vitals Group     BP 05/07/16 1859 120/63     Pulse Rate 05/07/16 1859 87     Resp 05/07/16 1859 18     Temp 05/07/16 1908 97.6 F (36.4 C)     Temp Source 05/07/16 1908 Oral     SpO2 05/07/16 1859 96 %     Weight 05/07/16 1909 152 lb (68.9 kg)     Height 05/07/16 1909 5\' 7"  (1.702 m)     Head Circumference --      Peak Flow --      Pain Score --      Pain Loc --      Pain Edu? --      Excl. in [place]? --     Constitutional: Alert and oriented. Well appearing and in  no acute distress. Eyes: Conjunctivae are normal. PERRL. EOMI. Head: Atraumatic. Nose: No congestion/rhinnorhea. Mouth/Throat: Mucous membranes are moist.   Neck: No stridor.   Cardiovascular: Normal rate, regular rhythm. Grossly normal heart sounds.   Respiratory: Normal respiratory effort.  No retractions. Lungs CTAB. Gastrointestinal: Soft and nontender. No distention. Musculoskeletal: No lower extremity tenderness nor edema.  Left hand with Velcro splint. Dressed skin tear. To the webspace between the index finger and thumb dorsally there is a 1 x 2 cm superficial skin tear which has a dry, recurrent scab without any surrounding pus, erythema, induration or tenderness palpation. Neurologic:  Normal speech and language. No gross focal neurologic deficits are  appreciated.  Skin:  Skin is warm, dry and intact. No rash noted. Psychiatric: Mood and affect are normal. Speech and behavior are normal.  ____________________________________________   LABS (all labs ordered are listed, but only abnormal results are displayed)  Labs Reviewed  COMPREHENSIVE METABOLIC PANEL - Abnormal; Notable for the following:       Result Value   Glucose, Bld 123 (*)    BUN 26 (*)    All other components within normal limits  CBC - Abnormal; Notable for the following:    RDW 14.7 (*)    Platelets 138 (*)    All other components within normal limits  URINALYSIS, COMPLETE (UACMP) WITH MICROSCOPIC - Abnormal; Notable for the following:    Color, Urine YELLOW (*)    APPearance CLEAR (*)    Protein, ur 30 (*)    Squamous Epithelial / LPF 0-5 (*)    All other components within normal limits  TROPONIN I - Abnormal; Notable for the following:    Troponin I 0.04 (*)    All other components within normal limits  TROPONIN I - Abnormal; Notable for the following:    Troponin I 0.03 (*)    All other components within normal limits  INFLUENZA PANEL BY PCR (TYPE A & B) - Abnormal; Notable for the following:    Influenza B By PCR POSITIVE (*)    All other components within normal limits   ____________________________________________  EKG  ED ECG REPORT I, Doran Stabler, the attending physician, personally viewed and interpreted this ECG.   Date: 05/07/2016  EKG Time: 1917  Rate: 86  Rhythm: Ventricular pacing.  Axis: Normal paced rhythm  Intervals:Wide ventricular complex consistent with pacing.  ST&T Change: No abnormal ST elevation or depression. No abnormal T-wave inversion.  ____________________________________________  RADIOLOGY   ____________________________________________   PROCEDURES  Procedure(s) performed:   Procedures  Critical Care performed:   ____________________________________________   INITIAL IMPRESSION / ASSESSMENT AND  PLAN / ED COURSE  Pertinent labs & imaging results that were available during my care of the patient were reviewed by me and considered in my medical decision making (see chart for details).   Clinical Course   Possible dehydration. We'll fluid hydrated. Will also recheck a troponin. Slightly elevated. This could be from dehydration/demand. Patient without with chest pain. Reassuring EKG.  Possible GI symptoms from doxycycline versus viral cause. Less likely C. difficile.  ----------------------------------------- 1100 PM on 05/07/2016 -----------------------------------------  Patient pending repeat troponin as well as fluid results at this time. Signed out to Dr. Owens Shark. ____________________________________________   FINAL CLINICAL IMPRESSION(S) / ED DIAGNOSES  Hypotension. Diarrhea.    NEW MEDICATIONS STARTED DURING THIS VISIT:  New Prescriptions   No medications on file     Note:  This document was prepared using Dragon voice recognition  software and may include unintentional dictation errors.    Orbie Pyo, MD 05/08/16 [id number]

## 2016-05-07 NOTE — ED Triage Notes (Signed)
Pt presents to ED via wheelchair with son with c/o decrease oral intake x 3 days and diarrhea two episodes tonight. Pt has been taking doxycycline x 7 days for skin tear on his LEFT hand. Son reports checked pt blood pressure and resulted 76/45. Pt denies chest pain, shortness of breath, or abdominal pain. Pt alert, skin warm and dry.

## 2016-05-07 NOTE — ED Notes (Signed)
Pt states he feels better.

## 2016-05-08 MED ORDER — OSELTAMIVIR PHOSPHATE 75 MG PO CAPS
75.0000 mg | ORAL_CAPSULE | Freq: Once | ORAL | Status: AC
  Administered 2016-05-08: 75 mg via ORAL
  Filled 2016-05-08: qty 1

## 2016-05-08 MED ORDER — OSELTAMIVIR PHOSPHATE 75 MG PO CAPS: 75.0000 mg | ORAL_CAPSULE | Freq: Two times a day (BID) | ORAL | 0 refills | Status: AC

## 2016-06-11 ENCOUNTER — Other Ambulatory Visit: Payer: Self-pay | Admitting: Gastroenterology

## 2016-06-11 DIAGNOSIS — K219 Gastro-esophageal reflux disease without esophagitis: Secondary | ICD-10-CM

## 2016-06-11 DIAGNOSIS — R1310 Dysphagia, unspecified: Secondary | ICD-10-CM

## 2016-06-14 ENCOUNTER — Ambulatory Visit
Admission: RE | Admit: 2016-06-14 | Discharge: 2016-06-14 | Disposition: A | Discharge status: Home / Self Care | Payer: Medicare Other | Source: Ambulatory Visit | Attending: Gastroenterology | Admitting: Gastroenterology

## 2016-06-14 DIAGNOSIS — K219 Gastro-esophageal reflux disease without esophagitis: Secondary | ICD-10-CM | POA: Diagnosis present

## 2016-06-14 DIAGNOSIS — K449 Diaphragmatic hernia without obstruction or gangrene: Secondary | ICD-10-CM | POA: Insufficient documentation

## 2016-06-14 DIAGNOSIS — R1310 Dysphagia, unspecified: Secondary | ICD-10-CM | POA: Diagnosis not present

## 2017-12-12 ENCOUNTER — Other Ambulatory Visit: Payer: Self-pay | Admitting: Gastroenterology

## 2017-12-12 DIAGNOSIS — R140 Abdominal distension (gaseous): Secondary | ICD-10-CM

## 2017-12-12 DIAGNOSIS — R100 Acute abdomen: Secondary | ICD-10-CM

## 2017-12-16 ENCOUNTER — Ambulatory Visit
Admission: RE | Admit: 2017-12-16 | Discharge: 2017-12-16 | Disposition: A | Discharge status: Home / Self Care | Payer: Medicare Other | Source: Ambulatory Visit | Attending: Gastroenterology | Admitting: Gastroenterology

## 2017-12-16 DIAGNOSIS — K4090 Unilateral inguinal hernia, without obstruction or gangrene, not specified as recurrent: Secondary | ICD-10-CM | POA: Diagnosis not present

## 2017-12-16 DIAGNOSIS — K5730 Diverticulosis of large intestine without perforation or abscess without bleeding: Secondary | ICD-10-CM | POA: Diagnosis not present

## 2017-12-16 DIAGNOSIS — N400 Enlarged prostate without lower urinary tract symptoms: Secondary | ICD-10-CM | POA: Diagnosis not present

## 2017-12-16 DIAGNOSIS — K8020 Calculus of gallbladder without cholecystitis without obstruction: Secondary | ICD-10-CM | POA: Diagnosis not present

## 2017-12-16 DIAGNOSIS — K449 Diaphragmatic hernia without obstruction or gangrene: Secondary | ICD-10-CM | POA: Insufficient documentation

## 2017-12-16 DIAGNOSIS — R100 Acute abdomen: Secondary | ICD-10-CM | POA: Diagnosis present

## 2017-12-16 DIAGNOSIS — I700 Atherosclerosis of aorta: Secondary | ICD-10-CM | POA: Diagnosis not present

## 2017-12-16 DIAGNOSIS — R140 Abdominal distension (gaseous): Secondary | ICD-10-CM | POA: Diagnosis present

## 2017-12-16 HISTORY — DX: Malignant (primary) neoplasm, unspecified: C80.1

## 2017-12-16 HISTORY — DX: Unspecified asthma, uncomplicated: J45.909

## 2017-12-16 MED ORDER — IOHEXOL 300 MG/ML  SOLN
80.0000 mL | Freq: Once | INTRAMUSCULAR | Status: AC | PRN
  Administered 2017-12-16: 80 mL via INTRAVENOUS

## 2017-12-24 ENCOUNTER — Ambulatory Visit: Payer: Medicare Other

## 2017-12-26 ENCOUNTER — Encounter: Payer: Self-pay | Admitting: Emergency Medicine

## 2017-12-26 ENCOUNTER — Emergency Department
Admission: EM | Admit: 2017-12-26 | Discharge: 2017-12-26 | Disposition: A | Discharge status: Home / Self Care | Payer: Medicare Other | Attending: Emergency Medicine | Admitting: Emergency Medicine

## 2017-12-26 ENCOUNTER — Other Ambulatory Visit: Payer: Self-pay

## 2017-12-26 DIAGNOSIS — Z85828 Personal history of other malignant neoplasm of skin: Secondary | ICD-10-CM | POA: Diagnosis not present

## 2017-12-26 DIAGNOSIS — Z950 Presence of cardiac pacemaker: Secondary | ICD-10-CM | POA: Insufficient documentation

## 2017-12-26 DIAGNOSIS — I130 Hypertensive heart and chronic kidney disease with heart failure and stage 1 through stage 4 chronic kidney disease, or unspecified chronic kidney disease: Secondary | ICD-10-CM | POA: Diagnosis not present

## 2017-12-26 DIAGNOSIS — J45909 Unspecified asthma, uncomplicated: Secondary | ICD-10-CM | POA: Diagnosis not present

## 2017-12-26 DIAGNOSIS — K55069 Acute infarction of intestine, part and extent unspecified: Secondary | ICD-10-CM | POA: Diagnosis not present

## 2017-12-26 DIAGNOSIS — K449 Diaphragmatic hernia without obstruction or gangrene: Secondary | ICD-10-CM | POA: Diagnosis not present

## 2017-12-26 DIAGNOSIS — N189 Chronic kidney disease, unspecified: Secondary | ICD-10-CM | POA: Diagnosis not present

## 2017-12-26 DIAGNOSIS — F0390 Unspecified dementia without behavioral disturbance: Secondary | ICD-10-CM | POA: Insufficient documentation

## 2017-12-26 DIAGNOSIS — I2510 Atherosclerotic heart disease of native coronary artery without angina pectoris: Secondary | ICD-10-CM | POA: Insufficient documentation

## 2017-12-26 DIAGNOSIS — I509 Heart failure, unspecified: Secondary | ICD-10-CM | POA: Diagnosis not present

## 2017-12-26 DIAGNOSIS — R109 Unspecified abdominal pain: Secondary | ICD-10-CM

## 2017-12-26 DIAGNOSIS — Z951 Presence of aortocoronary bypass graft: Secondary | ICD-10-CM | POA: Insufficient documentation

## 2017-12-26 LAB — CBC WITH DIFFERENTIAL/PLATELET
BASOS ABS: 0.1 10*3/uL (ref 0–0.1)
Basophils Relative: 1 %
EOS ABS: 0.8 10*3/uL — AB (ref 0–0.7)
EOS PCT: 8 %
HCT: 41.6 % (ref 40.0–52.0)
Hemoglobin: 14.6 g/dL (ref 13.0–18.0)
LYMPHS ABS: 2.4 10*3/uL (ref 1.0–3.6)
Lymphocytes Relative: 26 %
MCH: 32.5 pg (ref 26.0–34.0)
MCHC: 35 g/dL (ref 32.0–36.0)
MCV: 92.7 fL (ref 80.0–100.0)
MONO ABS: 1.1 10*3/uL — AB (ref 0.2–1.0)
Monocytes Relative: 12 %
Neutro Abs: 4.8 10*3/uL (ref 1.4–6.5)
Neutrophils Relative %: 53 %
Platelets: 146 10*3/uL — ABNORMAL LOW (ref 150–440)
RBC: 4.49 MIL/uL (ref 4.40–5.90)
RDW: 15.1 % — AB (ref 11.5–14.5)
WBC: 9.3 10*3/uL (ref 3.8–10.6)

## 2017-12-26 LAB — COMPREHENSIVE METABOLIC PANEL
ALT: 18 U/L (ref 0–44)
AST: 27 U/L (ref 15–41)
Albumin: 3.8 g/dL (ref 3.5–5.0)
Alkaline Phosphatase: 81 U/L (ref 38–126)
Anion gap: 6 (ref 5–15)
BUN: 20 mg/dL (ref 8–23)
CHLORIDE: 107 mmol/L (ref 98–111)
CO2: 27 mmol/L (ref 22–32)
CREATININE: 1.11 mg/dL (ref 0.61–1.24)
Calcium: 9 mg/dL (ref 8.9–10.3)
GFR, EST NON AFRICAN AMERICAN: 54 mL/min — AB (ref >60)
Glucose, Bld: 103 mg/dL — ABNORMAL HIGH (ref 70–99)
Potassium: 4.5 mmol/L (ref 3.5–5.1)
SODIUM: 140 mmol/L (ref 135–145)
Total Bilirubin: 0.9 mg/dL (ref 0.3–1.2)
Total Protein: 7.1 g/dL (ref 6.5–8.1)

## 2017-12-26 LAB — PROTIME-INR
INR: 0.91
PROTHROMBIN TIME: 12.2 s (ref 11.4–15.2)

## 2017-12-26 LAB — LACTIC ACID, PLASMA: LACTIC ACID, VENOUS: 1.7 mmol/L (ref 0.5–1.9)

## 2017-12-26 LAB — APTT: aPTT: 33 seconds (ref 24–36)

## 2017-12-26 MED ORDER — CLOPIDOGREL BISULFATE 75 MG PO TABS
75.0000 mg | ORAL_TABLET | ORAL | Status: AC
  Administered 2017-12-26: 75 mg via ORAL
  Filled 2017-12-26: qty 1

## 2017-12-26 MED ORDER — DIPHENHYDRAMINE HCL 50 MG/ML IJ SOLN
12.5000 mg | Freq: Once | INTRAMUSCULAR | Status: AC
  Administered 2017-12-26: 12.5 mg via INTRAVENOUS
  Filled 2017-12-26: qty 1

## 2017-12-26 NOTE — Discharge Instructions (Addendum)
Please take Plavix as prescribed by Dr. Delana Meyer.  Call Dr. Nino Parsley appointment line to schedule an appointment for next week.  Return to the emergency department if you develop severe pain, fever, changes in mental status, nausea vomiting or diarrhea, or any other symptoms concerning to you.

## 2017-12-26 NOTE — ED Triage Notes (Signed)
Pt reports that he has been having abd pain for the last week and he had a CT of his abd last week. Dr. Delana Meyer seen the results and told him to come to the ER because his has a blockage of his artery.

## 2017-12-26 NOTE — Consult Note (Signed)
[facility] Vascular Consult Note  MRN : [id number]  Larry Alexander is a 84 y.o. (Aug 05, 1917) male who presents with chief complaint of  Chief Complaint  Patient presents with  . Abnormal Lab  .  History of Present Illness:   I am asked to see the patient by Dr. Mariea Clonts.  Patient was brought to the emergency room by his son after he contacted our office stating that his father was in significant and increasing abdominal pain.  The patient has had approximately 1 month of abdominal pain.  This seem to begin abruptly.  Initially it was located in the left lower quadrant but seems to have moved to a more generalized region.  The patient is not the best historian with significant short-term memory loss.  The son says that sometimes it seems to be moderate and other times it seems to be excruciating in intensity.  It does not radiate to the back or flank.  It seems to be associated with eating but not necessarily.  Upon arrival in the ER and in discussion with the patient and his son patient is pain-free the really has not been any dramatic increase in the pain over the past 24 to 48 hours.  I believe there was some concern regarding the delay in getting the patient referred to vascular the cause the events of today.  Overall the son feels his father is about the same as he has been for the past several weeks and in fact feels that he might even be a little bit better.  Son estimates he is lost approximately 5 pounds over the past month but also notes that he had gained 15 pounds since the beginning of the year.  He does have occasional episodes of diarrhea and also does complain of bloating and cramping.  No fever chills.  Apparently he was recently treated for diverticulitis.  Current Facility-Administered Medications  Medication Dose Route Frequency Provider Last Rate Last Dose  . clopidogrel (PLAVIX) tablet 75 mg  75 mg Oral STAT Takyla Kuchera, Dolores Lory, MD       Current  Outpatient Medications  Medication Sig Dispense Refill  . acetaminophen (TYLENOL) 500 MG tablet Take 500-1,000 mg by mouth every 6 (six) hours as needed for mild pain or moderate pain.    Marland Kitchen albuterol (PROVENTIL HFA;VENTOLIN HFA) 108 (90 Base) MCG/ACT inhaler Inhale 2 puffs into the lungs every 6 (six) hours as needed for wheezing or shortness of breath.    Marland Kitchen aspirin EC 81 MG tablet Take 81 mg by mouth daily.    Marland Kitchen atorvastatin (LIPITOR) 20 MG tablet Take 20 mg by mouth daily.    . digoxin (LANOXIN) 0.125 MG tablet Take 0.0625 mg by mouth every other day.    . diphenhydrAMINE (BENADRYL) 25 MG tablet Take 25-50 mg by mouth at bedtime as needed for itching ((itchy rash caused by contrast media)).    . finasteride (PROSCAR) 5 MG tablet Take 5 mg by mouth daily.    . Fluticasone-Salmeterol (ADVAIR) 100-50 MCG/DOSE AEPB Inhale 1 puff into the lungs 2 (two) times daily.    . hydrocortisone cream 1 % Apply 1 application topically as directed. (for rash caused by contrast media)    . metoprolol tartrate (LOPRESSOR) 25 MG tablet Take 25 mg by mouth 2 (two) times daily.    . polyethylene glycol (MIRALAX / GLYCOLAX) packet Take 17 g by mouth daily as needed for mild constipation or moderate constipation.    . tamsulosin (  FLOMAX) 0.4 MG CAPS capsule Take 0.4 mg by mouth every evening.       Past Medical History:  Diagnosis Date  . Acute gout   . Aortic stenosis   . Asthma   . Atrial fibrillation ([place])   . Cancer ([place])    basal cell ca face  . CHF (congestive heart failure) ([place])   . Chronic kidney disease (CKD)   . COPD (chronic obstructive pulmonary disease) ([place])   . Coronary artery disease   . High cholesterol   . Hypertension   . Intermittent complete heart block ([place])   . S/P TAVR (transcatheter aortic valve replacement)     Past Surgical History:  Procedure Laterality Date  . AORTIC VALVE SURGERY    . CORONARY ARTERY BYPASS GRAFT    . PACEMAKER INSERTION    . THROMBOENDARTERECTOMY  FEMORAL ARTERY    . triple bypass      Social History Social History   Tobacco Use  . Smoking status: Not on file  Substance Use Topics  . Alcohol use: Not on file  . Drug use: Not on file    Family History History reviewed. No pertinent family history. No family history of bleeding/clotting disorders, porphyria or autoimmune disease   Allergies  Allergen Reactions  . Contrast Media [Iodinated Diagnostic Agents]     Oral and IV dye     REVIEW OF SYSTEMS (Negative unless checked)  Constitutional: [x] Weight loss  [] Fever  [] Chills Cardiac: [] Chest pain   [] Chest pressure   [] Palpitations   [] Shortness of breath when laying flat   [] Shortness of breath at rest   [] Shortness of breath with exertion. Vascular:  [] Pain in legs with walking   [] Pain in legs at rest   [] Pain in legs when laying flat   [] Claudication   [] Pain in feet when walking  [] Pain in feet at rest  [] Pain in feet when laying flat   [] History of DVT   [] Phlebitis   [] Swelling in legs   [] Varicose veins   [] Non-healing ulcers Pulmonary:   [] Uses home oxygen   [] Productive cough   [] Hemoptysis   [] Wheeze  [] COPD   [] Asthma Neurologic:  [] Dizziness  [] Blackouts   [] Seizures   [] History of stroke   [] History of TIA  [] Aphasia   [] Temporary blindness   [] Dysphagia   [] Weakness or numbness in arms   [] Weakness or numbness in legs Musculoskeletal:  [] Arthritis   [] Joint swelling   [] Joint pain   [] Low back pain Hematologic:  [] Easy bruising  [] Easy bleeding   [] Hypercoagulable state   [] Anemic  [] Hepatitis Gastrointestinal:  [] Blood in stool   [] Vomiting blood  [] Gastroesophageal reflux/heartburn   [] Difficulty swallowing. Genitourinary:  [] Chronic kidney disease   [] Difficult urination  [] Frequent urination  [] Burning with urination   [] Blood in urine Skin:  [] Rashes   [] Ulcers   [] Wounds Psychological:  [] History of anxiety   []  History of major depression.   Physical Examination  Vitals:   12/26/17 1611 12/26/17  1721 12/26/17 1730  BP: (!) 151/59 (!) 175/67 (!) 158/65  Pulse: (!) 59 (!) 59 60  Resp:   (!) 23  Temp: (!) 97.5 F (36.4 C)    TempSrc: Oral    SpO2: 99% 97% 98%  Weight: 71.7 kg    Height: 5\' 7"  (1.702 m)     Body mass index is 24.75 kg/m.  Head: [place]/AT, mild temporalis wasting. Ear/Nose/Throat: Nares w/o erythema or drainage, oropharynx w/o obstruction  Eyes: PERRLA, Sclera nonicteric.  Neck: Supple, No JVD.  Pulmonary:  Breath sounds equal bilaterally, no use of accessory muscles.  Cardiac: RRR,  + Murmurs, no rubs or gallops. Gastrointestinal: soft, non-tender, non-distended.  Musculoskeletal: Moves all extremities.  No deformity or atrophy. No edema. Neurologic: CN 2-12 intact. Symmetrical.  Speech is fluent.  Psychiatric: Judgment intact, Mood & affect appropriate for pt's clinical situation. Dermatologic: Mild macular rashes both arms no ulcers noted.  No cellulitis or open wounds.     CBC Lab Results  Component Value Date   WBC 9.3 12/26/2017   HGB 14.6 12/26/2017   HCT 41.6 12/26/2017   MCV 92.7 12/26/2017   PLT 146 (L) 12/26/2017    BMET    Component Value Date/Time   NA 140 12/26/2017 1635   NA 140 10/18/2013 2032   K 4.5 12/26/2017 1635   K 3.9 10/18/2013 2032   CL 107 12/26/2017 1635   CL 107 10/18/2013 2032   CO2 27 12/26/2017 1635   CO2 27 10/18/2013 2032   GLUCOSE 103 (H) 12/26/2017 1635   GLUCOSE 118 (H) 10/18/2013 2032   BUN 20 12/26/2017 1635   BUN 24 (H) 10/18/2013 2032   CREATININE 1.11 12/26/2017 1635   CREATININE 1.28 10/18/2013 2032   CALCIUM 9.0 12/26/2017 1635   CALCIUM 8.9 10/18/2013 2032   GFRNONAA 54 (L) 12/26/2017 1635   GFRNONAA 48 (L) 10/18/2013 2032   GFRAA >60 12/26/2017 1635   GFRAA 56 (L) 10/18/2013 2032   Estimated Creatinine Clearance: 35.6 mL/min (by C-G formula based on SCr of 1.11 mg/dL).  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  There is extensive calcifications of the abdominal aorta which are circumferential in nature and in the mid infrarenal portion represents a 60 to 70% diameter reduction.  There is also bilateral high-grade common iliac artery stenosis.    Incidental finding of a moderate hiatal hernia with the stomach herniated up posterior to the heart.   Assessment/Plan 1.  Abdominal pain: The patient certainly has significant atherosclerotic stenosis of the mesenteric vessels.  His history is fairly consistent with chronic mesenteric ischemia however, there does not appear to have been an acute change at this time.  In fact, the son feels he may even be a little bit better.  I have spent an extensive amount of time with the son demonstrating the findings of the CT including the hiatal hernia as well as his other vascular lesions.  I have also discussed endovascular repair the risks and benefits as well as the process.  We touched on open repair but patient is not a candidate for open surgery.  I have also discussed medical therapy and will add Plavix.    Given my discussion I offered angiography with possible intervention for tomorrow late afternoon (although given his stable situation I do not think he needs urgent revascularization and therefore do not feel that this would be the optimal treatment plan) or this coming Tuesday or Wednesday.  After considering this, the patient and his son actually asked if they could come back and see me in the office in approximately 2 weeks.  Son feels that with his new knowledge he would like to reassess how his father is doing and given his age and multiple comorbidities  I believe this is a reasonable plan.  We did discuss that if he does have an acute change independent of food intake that we may have to move forward emergently and the  son acknowledges this.  2.  Dye allergy: Patient had significant reaction after his last CT scan and therefore dye prophylaxis will be a must.  Again several days of steroids prior to any intervention would be of benefit in preventing further dye reaction and therefore moving forward urgently in the face of an asymptomatic patient seems imprudent.  3.  Renal insufficiency: Patient has mild renal insufficiency and should be hydrated pre-angios if he should choose to move forward with revascularization.    Hortencia Pilar, MD  12/26/2017 7:08 PM

## 2017-12-26 NOTE — ED Provider Notes (Signed)
[facility] Emergency Department Provider Note  ____________________________________________  Time seen: Approximately 4:51 PM  I have reviewed the triage vital signs and the nursing notes.   HISTORY  Chief Complaint Abnormal Lab    HPI Larry Alexander is a 84 y.o. male sent by Dr. Delana Meyer for occlusion of the superior mesenteric artery.  The patient is accompanied by his son and is unable to give history due to his baseline dementia.  Son reports that for the past 4 to 5 weeks, his father has been having a diffuse, central abdominal pain which is worse after meals.  His PCP sent him to a gastroenterologist who ordered a CT of the abdomen on 12/16/2017, which found occlusion.  Today, the patient was seen in the vascular clinic, and told to come to the emergency department for admission for stent placement in the morning.  At this time, the patient is asymptomatic.  He denies any associated fever, nausea or vomiting.  He has had intermittent nonbloody diarrhea.  Past Medical History:  Diagnosis Date  . Acute gout   . Aortic stenosis   . Asthma   . Atrial fibrillation ([place])   . Cancer ([place])    basal cell ca face  . CHF (congestive heart failure) ([place])   . Chronic kidney disease (CKD)   . COPD (chronic obstructive pulmonary disease) ([place])   . Coronary artery disease   . High cholesterol   . Hypertension   . Intermittent complete heart block ([place])   . S/P TAVR (transcatheter aortic valve replacement)     There are no active problems to display for this patient.   Past Surgical History:  Procedure Laterality Date  . AORTIC VALVE SURGERY    . CORONARY ARTERY BYPASS GRAFT    . PACEMAKER INSERTION    . THROMBOENDARTERECTOMY FEMORAL ARTERY    . triple bypass        Allergies Contrast media [iodinated diagnostic agents]  History reviewed. No pertinent family history.  Social History Social History   Tobacco Use  . Smoking status: Not on file   Substance Use Topics  . Alcohol use: Not on file  . Drug use: Not on file    Review of Systems Constitutional: No fever/chills. Eyes: No visual changes. ENT: No sore throat. No congestion or rhinorrhea. Cardiovascular: Denies chest pain. Denies palpitations. Respiratory: Denies shortness of breath.  No cough. Gastrointestinal: + abdominal pain.  No nausea, no vomiting.  + diarrhea.  No constipation. Genitourinary: Negative for dysuria. Musculoskeletal: Negative for back pain. Skin: Negative for rash. Neurological: Negative for headaches. No focal numbness, tingling or weakness.     ____________________________________________   PHYSICAL EXAM:  VITAL SIGNS: ED Triage Vitals  Enc Vitals Group     BP 12/26/17 1611 (!) 151/59     Pulse Rate 12/26/17 1611 (!) 59     Resp --      Temp 12/26/17 1611 (!) 97.5 F (36.4 C)     Temp Source 12/26/17 1611 Oral     SpO2 12/26/17 1611 99 %     Weight 12/26/17 1611 158 lb (71.7 kg)     Height 12/26/17 1611 5\' 7"  (1.702 m)     Head Circumference --      Peak Flow --      Pain Score 12/26/17 1618 0     Pain Loc --      Pain Edu? --      Excl. in [place]? --  Constitutional: Alert but not oriented. Answers questions appropriately. Eyes: Conjunctivae are normal.  EOMI. No scleral icterus. Head: Atraumatic. Nose: No congestion/rhinnorhea. Mouth/Throat: Mucous membranes are moist.  Neck: No stridor.  Supple.   Cardiovascular: Normal rate, regular rhythm. No murmurs, rubs or gallops.  Respiratory: Normal respiratory effort.  No accessory muscle use or retractions. Lungs CTAB.  No wheezes, rales or ronchi. Gastrointestinal: Overweight.  Soft, nontender and nondistended.  No guarding or rebound.  No peritoneal signs. Musculoskeletal: No LE edema. Neurologic: alert but not oriented.  Speech is clear.  Face and smile are symmetric.  EOMI.  Moves all extremities well. Skin:  Skin is warm, dry and intact. No rash noted. Psychiatric: Mood  and affect are normal.   ____________________________________________   LABS (all labs ordered are listed, but only abnormal results are displayed)  Labs Reviewed  CBC WITH DIFFERENTIAL/PLATELET - Abnormal; Notable for the following components:      Result Value   RDW 15.1 (*)    Platelets 146 (*)    Monocytes Absolute 1.1 (*)    Eosinophils Absolute 0.8 (*)    All other components within normal limits  COMPREHENSIVE METABOLIC PANEL - Abnormal; Notable for the following components:   Glucose, Bld 103 (*)    GFR calc non Af Amer 54 (*)    All other components within normal limits  PROTIME-INR  LACTIC ACID, PLASMA  APTT   ____________________________________________  EKG  ED ECG REPORT I, Anne-Caroline Mariea Clonts, the attending physician, personally viewed and interpreted this ECG.   Date: 12/26/2017  EKG Time: 1725  Rate:60  Rhythm: AV paced   ____________________________________________  RADIOLOGY  No results found.  ____________________________________________   PROCEDURES  Procedure(s) performed: None  Procedures  Critical Care performed: No ____________________________________________   INITIAL IMPRESSION / ASSESSMENT AND PLAN / ED COURSE  Pertinent labs & imaging results that were available during my care of the patient were reviewed by me and considered in my medical decision making (see chart for details).  84 y.o. male sent to the emergency department for admission for intervention on an occluded SMA, with 4 to 5 weeks of symptoms.  Overall, the patient is hemodynamically stable and this is likely an acute on chronic issue.  I do not see any toxicity that would be suggestive of acute ischemia.  However lactic acid has been ordered.  We will also get a EKG and basic laboratory studies.  I have made a call to Dr. Delana Meyer and I am waiting to hear back from him.  ----------------------------------------- 7:30 PM on  12/26/2017 -----------------------------------------  The patient has been seen and evaluated by Dr. Delana Meyer, who will initiate Plavix and see the patient in the outpatient setting next week.  I did give the patient return precautions as well as follow-up instructions.  At this time, the patient is safe for discharge home.  ____________________________________________  FINAL CLINICAL IMPRESSION(S) / ED DIAGNOSES  Final diagnoses:  Occlusion of superior mesenteric artery (HCC)  Intermittent abdominal pain         NEW MEDICATIONS STARTED DURING THIS VISIT:  New Prescriptions   No medications on file      Eula Listen, MD 12/26/17 1932

## 2017-12-26 NOTE — ED Triage Notes (Signed)
FIRST NURSE NOTE-sent by MD after CT scan.  Pt ambulatory, NAD

## 2017-12-26 NOTE — ED Notes (Signed)
Pt provided with warm blanket. 

## 2018-01-06 ENCOUNTER — Encounter (INDEPENDENT_AMBULATORY_CARE_PROVIDER_SITE_OTHER): Payer: Self-pay | Admitting: Vascular Surgery

## 2018-01-06 ENCOUNTER — Ambulatory Visit (INDEPENDENT_AMBULATORY_CARE_PROVIDER_SITE_OTHER): Payer: Medicare Other | Admitting: Vascular Surgery

## 2018-01-06 VITALS — BP 140/68 | HR 61 | Resp 15 | Ht 66.5 in | Wt 160.0 lb

## 2018-01-06 DIAGNOSIS — E785 Hyperlipidemia, unspecified: Secondary | ICD-10-CM | POA: Insufficient documentation

## 2018-01-06 DIAGNOSIS — I10 Essential (primary) hypertension: Secondary | ICD-10-CM | POA: Insufficient documentation

## 2018-01-06 DIAGNOSIS — I700 Atherosclerosis of aorta: Secondary | ICD-10-CM

## 2018-01-06 DIAGNOSIS — K551 Chronic vascular disorders of intestine: Secondary | ICD-10-CM | POA: Insufficient documentation

## 2018-01-06 NOTE — Progress Notes (Signed)
Subjective:    Patient ID: Larry Alexander, male    DOB: 12-Jul-1917, 84 y.o.   MRN: [id number] Chief Complaint  Patient presents with  . New Patient (Initial Visit)    Stomach pains   Patient presents for his first office visit after being seen at [facility] emergency department on December 26, 2017.  The patient was initially seen for abdominal pain.  A CTA of the abdomen was notable for severe aortic atherosclerosis as well as severe superior mesenteric artery stenosis.  At that time, the patient was started on Plavix, he was discharged home and is following up in our office today to discuss possibly moving forward with an angiogram.  The patient is seen with his son as the patient has vascular dementia.  The patient's son notes his symptoms have improved slightly.  He notes that he does not complain of abdominal pain as frequently as he did before the Plavix was started.  It does seem like the patient has developed a tear of eating.  The patient's son does note he does eat considerably less food and has lost weight over the last 6 weeks.  The patient does not seem to get nauseous nor vomit after he eats. The patient is also on aspirin and a statin.  The patient denies any fever, nausea vomiting.  Denies any changes in bowel habits.  Review of Systems  Constitutional: Negative.   HENT: Negative.   Eyes: Negative.   Respiratory: Negative.   Cardiovascular: Negative.   Gastrointestinal: Positive for abdominal pain.  Endocrine: Negative.   Genitourinary: Negative.   Musculoskeletal: Negative.   Skin: Negative.   Allergic/Immunologic: Negative.   Neurological: Negative.   Hematological: Negative.   Psychiatric/Behavioral: Negative.       Objective:   Physical Exam  Constitutional: He is oriented to person, place, and time. He appears well-developed and well-nourished.  HENT:  Head: Normocephalic and atraumatic.  Right Ear: External ear normal.  Left Ear: External  ear normal.  Eyes: Pupils are equal, round, and reactive to light. Conjunctivae and EOM are normal.  Neck: Normal range of motion.  Cardiovascular: Normal rate, regular rhythm, normal heart sounds and intact distal pulses.  Pulses:      Radial pulses are 2+ on the right side, and 2+ on the left side.  Unable to palpate pedal pulses however the bilateral feet are warm.  Sluggish capillary refill.  Pulmonary/Chest: Effort normal and breath sounds normal.  Abdominal: Soft. Bowel sounds are normal. He exhibits no distension. There is no tenderness. There is no guarding.  Musculoskeletal: Normal range of motion. He exhibits no edema.  Neurological: He is alert and oriented to person, place, and time.  Skin: Skin is warm and dry.  Psychiatric: He has a normal mood and affect. His behavior is normal. Judgment and thought content normal.  Vitals reviewed.  BP 140/68 (BP Location: Right Arm, Patient Position: Sitting)   Pulse 61   Resp 15   Ht 5' 6.5" (1.689 m)   Wt 160 lb (72.6 kg)   BMI 25.44 kg/m   Past Medical History:  Diagnosis Date  . Acute gout   . Aortic stenosis   . Asthma   . Atrial fibrillation ([place])   . Cancer ([place])    basal cell ca face  . CHF (congestive heart failure) ([place])   . Chronic kidney disease (CKD)   . COPD (chronic obstructive pulmonary disease) ([place])   . Coronary artery disease   .  High cholesterol   . Hypertension   . Intermittent complete heart block ([place])   . S/P TAVR (transcatheter aortic valve replacement)    Social History   Socioeconomic History  . Marital status: Married    Spouse name: Not on file  . Number of children: Not on file  . Years of education: Not on file  . Highest education level: Not on file  Occupational History  . Not on file  Social Needs  . Financial resource strain: Not on file  . Food insecurity:    Worry: Not on file    Inability: Not on file  . Transportation needs:    Medical: Not on file    Non-medical: Not on  file  Tobacco Use  . Smoking status: Former Research scientist (life sciences)  . Smokeless tobacco: Never Used  Substance and Sexual Activity  . Alcohol use: Not Currently  . Drug use: Not on file  . Sexual activity: Not on file  Lifestyle  . Physical activity:    Days per week: Not on file    Minutes per session: Not on file  . Stress: Not on file  Relationships  . Social connections:    Talks on phone: Not on file    Gets together: Not on file    Attends religious service: Not on file    Active member of club or organization: Not on file    Attends meetings of clubs or organizations: Not on file    Relationship status: Not on file  . Intimate partner violence:    Fear of current or ex partner: Not on file    Emotionally abused: Not on file    Physically abused: Not on file    Forced sexual activity: Not on file  Other Topics Concern  . Not on file  Social History Narrative  . Not on file   Past Surgical History:  Procedure Laterality Date  . AORTIC VALVE SURGERY    . CORONARY ARTERY BYPASS GRAFT    . PACEMAKER INSERTION    . THROMBOENDARTERECTOMY FEMORAL ARTERY    . triple bypass     History reviewed. No pertinent family history.  Allergies  Allergen Reactions  . Contrast Media [Iodinated Diagnostic Agents]     Oral and IV dye      Assessment & Plan:  Patient presents for his first office visit after being seen at [facility] emergency department on December 26, 2017.  The patient was initially seen for abdominal pain.  A CTA of the abdomen was notable for severe aortic atherosclerosis as well as severe superior mesenteric artery stenosis.  At that time, the patient was started on Plavix, he was discharged home and is following up in our office today to discuss possibly moving forward with an angiogram.  The patient is seen with his son as the patient has vascular dementia.  The patient's son notes his symptoms have improved slightly.  He notes that he does not complain  of abdominal pain as frequently as he did before the Plavix was started.  It does seem like the patient has developed a tear of eating.  The patient's son does note he does eat considerably less food and has lost weight over the last 6 weeks.  The patient does not seem to get nauseous nor vomit after he eats. The patient is also on aspirin and a statin.  The patient denies any fever, nausea vomiting.  Denies any changes in bowel habits.  1. Mesenteric  artery stenosis [facility]) - New Patient with severe stenosis of the SMA on recent CTA The patient's symptoms have improved slightly with the addition of Plavix to his regimen Had a long discussion with the patient and his son about possibly moving forward with a mesenteric angiogram to assess the patient's anatomy and if appropriate an attempt to fix the area of stenosis can be made The patient's symptoms have improved slightly however with the severity of his stenosis has the potential to worsen and can lead to potentially life-threatening situations such as ischemic colitis. Recommend a mesenteric angiogram with possible intervention Procedure, risks and benefits explained to the patient All questions answered Will also discuss this with Dr. Delana Meyer  2. Aortic atherosclerosis ([place]) - New Patient was found to have severe aortic atherosclerotic disease on recent CTA. Due to the severity of his disease and the natural progression of atherosclerotic disease I would recommend the patient undergoing an angiogram of the aorta to assess the exact degree of narrowing and if appropriate an attempt can be made to correct blood flow We will discuss this with Dr. Delana Meyer  3. Hyperlipidemia, unspecified hyperlipidemia type - Stable On ASA, Statin and Plavix. Encouraged good control as its slows the progression of atherosclerotic disease  4. Essential hypertension - Stable Encouraged good control as its slows the progression of atherosclerotic disease  Current  Outpatient Medications on File Prior to Visit  Medication Sig Dispense Refill  . acetaminophen (TYLENOL) 500 MG tablet Take 500-1,000 mg by mouth every 6 (six) hours as needed for mild pain or moderate pain.    Marland Kitchen albuterol (PROVENTIL HFA;VENTOLIN HFA) 108 (90 Base) MCG/ACT inhaler Inhale 2 puffs into the lungs every 6 (six) hours as needed for wheezing or shortness of breath.    Marland Kitchen aspirin EC 81 MG tablet Take 81 mg by mouth daily.    Marland Kitchen atorvastatin (LIPITOR) 20 MG tablet Take 20 mg by mouth daily.    . clopidogrel (PLAVIX) 75 MG tablet Take by mouth.    . digoxin (LANOXIN) 0.125 MG tablet Take 0.0625 mg by mouth every other day.    . finasteride (PROSCAR) 5 MG tablet Take 5 mg by mouth daily.    . Fluticasone-Salmeterol (ADVAIR) 100-50 MCG/DOSE AEPB Inhale 1 puff into the lungs 2 (two) times daily.    . metoprolol tartrate (LOPRESSOR) 25 MG tablet Take 25 mg by mouth 2 (two) times daily.    . polyethylene glycol (MIRALAX / GLYCOLAX) packet Take 17 g by mouth daily as needed for mild constipation or moderate constipation.    . tamsulosin (FLOMAX) 0.4 MG CAPS capsule Take 0.4 mg by mouth every evening.     . diphenhydrAMINE (BENADRYL) 25 MG tablet Take 25-50 mg by mouth at bedtime as needed for itching ((itchy rash caused by contrast media)).    . hydrocortisone cream 1 % Apply 1 application topically as directed. (for rash caused by contrast media)     No current facility-administered medications on file prior to visit.    There are no Patient Instructions on file for this visit. No follow-ups on file.  Asahel Risden A Joye Wesenberg, PA-C

## 2018-01-10 ENCOUNTER — Other Ambulatory Visit: Payer: Self-pay

## 2018-01-10 ENCOUNTER — Inpatient Hospital Stay: Payer: Medicare Other | Attending: Hematology and Oncology | Admitting: Hematology and Oncology

## 2018-01-10 ENCOUNTER — Encounter: Payer: Self-pay | Admitting: Hematology and Oncology

## 2018-01-10 ENCOUNTER — Inpatient Hospital Stay: Payer: Medicare Other

## 2018-01-10 ENCOUNTER — Encounter (INDEPENDENT_AMBULATORY_CARE_PROVIDER_SITE_OTHER): Payer: Self-pay

## 2018-01-10 ENCOUNTER — Telehealth (INDEPENDENT_AMBULATORY_CARE_PROVIDER_SITE_OTHER): Payer: Self-pay | Admitting: Vascular Surgery

## 2018-01-10 VITALS — BP 151/76 | HR 76 | Temp 97.9°F | Resp 18 | Ht 66.0 in | Wt 159.4 lb

## 2018-01-10 DIAGNOSIS — M899 Disorder of bone, unspecified: Secondary | ICD-10-CM

## 2018-01-10 DIAGNOSIS — I509 Heart failure, unspecified: Secondary | ICD-10-CM | POA: Insufficient documentation

## 2018-01-10 DIAGNOSIS — E7800 Pure hypercholesterolemia, unspecified: Secondary | ICD-10-CM | POA: Insufficient documentation

## 2018-01-10 DIAGNOSIS — Z7982 Long term (current) use of aspirin: Secondary | ICD-10-CM | POA: Diagnosis not present

## 2018-01-10 DIAGNOSIS — Z9181 History of falling: Secondary | ICD-10-CM

## 2018-01-10 DIAGNOSIS — K8020 Calculus of gallbladder without cholecystitis without obstruction: Secondary | ICD-10-CM

## 2018-01-10 DIAGNOSIS — J449 Chronic obstructive pulmonary disease, unspecified: Secondary | ICD-10-CM | POA: Diagnosis not present

## 2018-01-10 DIAGNOSIS — Z87891 Personal history of nicotine dependence: Secondary | ICD-10-CM | POA: Insufficient documentation

## 2018-01-10 DIAGNOSIS — R109 Unspecified abdominal pain: Secondary | ICD-10-CM | POA: Diagnosis not present

## 2018-01-10 DIAGNOSIS — R778 Other specified abnormalities of plasma proteins: Secondary | ICD-10-CM | POA: Diagnosis not present

## 2018-01-10 DIAGNOSIS — Z7902 Long term (current) use of antithrombotics/antiplatelets: Secondary | ICD-10-CM | POA: Diagnosis not present

## 2018-01-10 DIAGNOSIS — K5730 Diverticulosis of large intestine without perforation or abscess without bleeding: Secondary | ICD-10-CM | POA: Diagnosis not present

## 2018-01-10 DIAGNOSIS — Z950 Presence of cardiac pacemaker: Secondary | ICD-10-CM

## 2018-01-10 DIAGNOSIS — N189 Chronic kidney disease, unspecified: Secondary | ICD-10-CM | POA: Diagnosis not present

## 2018-01-10 DIAGNOSIS — R262 Difficulty in walking, not elsewhere classified: Secondary | ICD-10-CM | POA: Diagnosis not present

## 2018-01-10 DIAGNOSIS — R5383 Other fatigue: Secondary | ICD-10-CM

## 2018-01-10 DIAGNOSIS — R634 Abnormal weight loss: Secondary | ICD-10-CM | POA: Diagnosis not present

## 2018-01-10 DIAGNOSIS — Z79899 Other long term (current) drug therapy: Secondary | ICD-10-CM | POA: Insufficient documentation

## 2018-01-10 DIAGNOSIS — I2510 Atherosclerotic heart disease of native coronary artery without angina pectoris: Secondary | ICD-10-CM | POA: Insufficient documentation

## 2018-01-10 DIAGNOSIS — I4891 Unspecified atrial fibrillation: Secondary | ICD-10-CM | POA: Insufficient documentation

## 2018-01-10 DIAGNOSIS — R630 Anorexia: Secondary | ICD-10-CM | POA: Diagnosis not present

## 2018-01-10 DIAGNOSIS — I130 Hypertensive heart and chronic kidney disease with heart failure and stage 1 through stage 4 chronic kidney disease, or unspecified chronic kidney disease: Secondary | ICD-10-CM | POA: Insufficient documentation

## 2018-01-10 LAB — COMPREHENSIVE METABOLIC PANEL
ALT: 17 U/L (ref 0–44)
AST: 24 U/L (ref 15–41)
Albumin: 3.9 g/dL (ref 3.5–5.0)
Alkaline Phosphatase: 129 U/L — ABNORMAL HIGH (ref 38–126)
Anion gap: 10 (ref 5–15)
BUN: 20 mg/dL (ref 8–23)
CO2: 23 mmol/L (ref 22–32)
Calcium: 9.1 mg/dL (ref 8.9–10.3)
Chloride: 105 mmol/L (ref 98–111)
Creatinine, Ser: 1.13 mg/dL (ref 0.61–1.24)
GFR calc Af Amer: >60 mL/min (ref >60)
GFR calc non Af Amer: 52 mL/min — ABNORMAL LOW (ref >60)
Glucose, Bld: 116 mg/dL — ABNORMAL HIGH (ref 70–99)
Potassium: 4 mmol/L (ref 3.5–5.1)
Sodium: 138 mmol/L (ref 135–145)
Total Bilirubin: 1.1 mg/dL (ref 0.3–1.2)
Total Protein: 7.8 g/dL (ref 6.5–8.1)

## 2018-01-10 LAB — CBC WITH DIFFERENTIAL/PLATELET
Basophils Absolute: 0 10*3/uL (ref 0–0.1)
Basophils Relative: 1 %
Eosinophils Absolute: 1.2 10*3/uL — ABNORMAL HIGH (ref 0–0.7)
Eosinophils Relative: 19 %
HCT: 40.7 % (ref 40.0–52.0)
Hemoglobin: 14.1 g/dL (ref 13.0–18.0)
Lymphocytes Relative: 22 %
Lymphs Abs: 1.3 10*3/uL (ref 1.0–3.6)
MCH: 32.3 pg (ref 26.0–34.0)
MCHC: 34.6 g/dL (ref 32.0–36.0)
MCV: 93.4 fL (ref 80.0–100.0)
Monocytes Absolute: 1 10*3/uL (ref 0.2–1.0)
Monocytes Relative: 17 %
Neutro Abs: 2.7 10*3/uL (ref 1.4–6.5)
Neutrophils Relative %: 43 %
Platelets: 152 10*3/uL (ref 150–440)
RBC: 4.36 MIL/uL — ABNORMAL LOW (ref 4.40–5.90)
RDW: 15.2 % — ABNORMAL HIGH (ref 11.5–14.5)
WBC: 6.2 10*3/uL (ref 3.8–10.6)

## 2018-01-10 NOTE — Progress Notes (Signed)
[facility] day:  01/10/2018  Chief Complaint: Larry Alexander is a 84 y.o. male with multiple lucent lesions I pelvis who is referred in consultation by Laurine Blazer, NP for assessment and management.  HPI:  The patient has been followed in the GI clinic for abdominal pain.   He has lost 7-8 pounds.  His son notes that he is eating 50% less.  He denies and fever or sweats.  He has had trouble walking for 6-8 months.  He "grabs his hips" at times.  He has been "more staggery".  He has fallen once in the middle of the night.  Abdomen and pelvis CT on 12/16/2017 revealed multiple lucent lesions throughout the pelvis measuring up to 16 mm in the left ilium. Findings may represent metastasis or myeloma.  Clinical correlation was recommended.  In addition, there was cholelithiasis, mild sigmoid diverticulosis without findings of acute diverticulitis, prostate enlargement, small inguinal hernias containing fat, severe aortic atherosclerosis, and severe stenosis of SMA origin.  SPEP on 12/19/2017 revealed a 0.4 gm/dl monoclonal spike.  24 hour UPEP on 12/24/2017 revealed bo monoclonal protein.  PSA was 1.38.  CBC on 08/05//2019 revealed a hematocrit of 42.2, hemoglobin 14.4, MCV 93.4, platelets 144,000, and WBC 6800.  Creatinine was 1.2, calcium 9.5, albumen 4.0, and LFTs normal.  Regarding his diet, he is eating about 1/2 portion for breakfast (egg and toast).  He will drink Ensure or Carnation Instant breakfast.  He will eat meat and potatoes (1/2 portion) for dinner.  He likes to eat ice cream.  His mother had leukemia.     Past Medical History:  Diagnosis Date  . Acute gout   . Aortic stenosis   . Asthma   . Atrial fibrillation ([place])   . Cancer ([place])    basal cell ca face  . CHF (congestive heart failure) ([place])   . Chronic kidney disease (CKD)   . COPD (chronic obstructive pulmonary disease) ([place])   . Coronary artery disease   . High cholesterol    . Hypertension   . Intermittent complete heart block ([place])   . S/P TAVR (transcatheter aortic valve replacement)     Past Surgical History:  Procedure Laterality Date  . AORTIC VALVE SURGERY    . CORONARY ARTERY BYPASS GRAFT    . PACEMAKER INSERTION    . THROMBOENDARTERECTOMY FEMORAL ARTERY    . triple bypass      Family History  Problem Relation Age of Onset  . Cancer Mother     Social History:  reports that he has quit smoking. He has never used smokeless tobacco. He reports that he drank alcohol. He reports that he does not use drugs.  He started smoking in the [employer].  He stopped smoking at 29-71 years old.  He is retired from [employer].  He worked in [employer].  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  He lives with his son and daughter-in-law.  The patients sons are his medical power of attorney.  The patient is accompanied by his son, Larry Alexander, today.  Allergies:  Allergies  Allergen Reactions  . Contrast Media [Iodinated Diagnostic Agents]     Oral and IV dye    Current Medications: Current Outpatient Medications  Medication Sig Dispense Refill  . acetaminophen (TYLENOL) 500 MG tablet Take 500-1,000 mg by mouth every 6 (six) hours as needed for mild pain or moderate pain.    Marland Kitchen albuterol (PROVENTIL HFA;VENTOLIN  HFA) 108 (90 Base) MCG/ACT inhaler Inhale 2 puffs into the lungs every 6 (six) hours as needed for wheezing or shortness of breath.    Marland Kitchen aspirin EC 81 MG tablet Take 81 mg by mouth daily.    Marland Kitchen atorvastatin (LIPITOR) 20 MG tablet Take 20 mg by mouth daily.    . clopidogrel (PLAVIX) 75 MG tablet Take by mouth.    . digoxin (LANOXIN) 0.125 MG tablet Take 0.0625 mg by mouth every other day.    . diphenhydrAMINE (BENADRYL) 25 MG tablet Take 25-50 mg by mouth at bedtime as needed for itching ((itchy rash caused by contrast media)).    . finasteride (PROSCAR) 5 MG tablet Take 5 mg by mouth daily.    . Fluticasone-Salmeterol (ADVAIR) 100-50 MCG/DOSE AEPB Inhale 1  puff into the lungs 2 (two) times daily.    . hydrocortisone cream 1 % Apply 1 application topically as directed. (for rash caused by contrast media)    . metoprolol tartrate (LOPRESSOR) 25 MG tablet Take 25 mg by mouth 2 (two) times daily.    Marland Kitchen omeprazole (PRILOSEC) 20 MG capsule Take 20 mg by mouth daily.    . polyethylene glycol (MIRALAX / GLYCOLAX) packet Take 17 g by mouth daily as needed for mild constipation or moderate constipation.    . tamsulosin (FLOMAX) 0.4 MG CAPS capsule Take 0.4 mg by mouth every evening.      No current facility-administered medications for this visit.     Review of Systems:  GENERAL:  Sleeps more.  No fevers, sweats.  Weight loss of 7-9 pounds. PERFORMANCE STATUS (ECOG):  2 HEENT:  Decreased hearing.  No visual changes, runny nose, sore throat, mouth sores or tenderness. Lungs: COPD.  No shortness of breath.  Slight productive cough.  No hemoptysis. Cardiac:  No chest pain, palpitations, orthopnea, or PND. GI:  Decreased appetite.  Diarrhea and constipation (uses Miralax).  Intermittent abdominal pain.  No nausea, vomiting, melena or hematochezia. GU:  No urgency, frequency, dysuria, or hematuria. Musculoskeletal:  No back pain.  Grabs hips at times.  No muscle tenderness. Extremities:  No pain or swelling. Skin:  Dry and scaly.  No rashes or skin changes. Neuro:  Short term memory is poor.  Difficulty ambulating.  No headache, numbness or weakness, balance or coordination issues. Endocrine:  No diabetes, thyroid issues, hot flashes or night sweats. Psych:  No mood changes, depression or anxiety. Pain:  No focal pain. Review of systems:  All other systems reviewed and found to be negative.  Physical Exam: Blood pressure (!) 151/76, pulse 76, temperature 97.9 F (36.6 C), temperature source Tympanic, resp. rate 18, height '5\' 6"'$  (1.676 m), weight 159 lb 7 oz (72.3 kg). GENERAL:  Well developed, well nourished, elderly gentleman sitting comfortably in the  exam room in no acute distress. MENTAL STATUS:  Alert and oriented to person, place and time. HEAD:  Pearline Cables hair.  Normocephalic, atraumatic, face symmetric, no Cushingoid features. EYES:  Hazel eyes.  Pupils equal round and reactive to light and accomodation.  No conjunctivitis or scleral icterus. ENT:  Oropharynx clear without lesion.  Tongue normal. Mucous membranes moist.  RESPIRATORY:  Clear to auscultation without rales, wheezes or rhonchi. CARDIOVASCULAR:  Regular rate and rhythm without murmur, rub or gallop. ABDOMEN:  Soft, non-tender, with active bowel sounds, and no hepatosplenomegaly.  No masses. SKIN:  No rashes, ulcers or lesions. EXTREMITIES: No edema, no skin discoloration or tenderness.  No palpable cords. LYMPH NODES: No palpable cervical,  supraclavicular, axillary or inguinal adenopathy  NEUROLOGICAL: Unremarkable. PSYCH:  Appropriate.   Orders Only on 01/10/2018  Component Date Value Ref Range Status  . Sodium 01/10/2018 138  135 - 145 mmol/L Final  . Potassium 01/10/2018 4.0  3.5 - 5.1 mmol/L Final  . Chloride 01/10/2018 105  98 - 111 mmol/L Final  . CO2 01/10/2018 23  22 - 32 mmol/L Final  . Glucose, Bld 01/10/2018 116* 70 - 99 mg/dL Final  . BUN 01/10/2018 20  8 - 23 mg/dL Final  . Creatinine, Ser 01/10/2018 1.13  0.61 - 1.24 mg/dL Final  . Calcium 01/10/2018 9.1  8.9 - 10.3 mg/dL Final  . Total Protein 01/10/2018 7.8  6.5 - 8.1 g/dL Final  . Albumin 01/10/2018 3.9  3.5 - 5.0 g/dL Final  . AST 01/10/2018 24  15 - 41 U/L Final  . ALT 01/10/2018 17  0 - 44 U/L Final  . Alkaline Phosphatase 01/10/2018 129* 38 - 126 U/L Final  . Total Bilirubin 01/10/2018 1.1  0.3 - 1.2 mg/dL Final  . GFR calc non Af Amer 01/10/2018 52* >60 mL/min Final  . GFR calc Af Amer 01/10/2018 >60  >60 mL/min Final   Comment: (NOTE) The eGFR has been calculated using the CKD EPI equation. This calculation has not been validated in all clinical situations. eGFR's persistently <60 mL/min  signify possible Chronic Kidney Disease.   Georgiann Hahn gap 01/10/2018 10  5 - 15 Final   Performed at [facility], [address] [place], [place] [address]  . WBC 01/10/2018 6.2  3.8 - 10.6 K/uL Final  . RBC 01/10/2018 4.36* 4.40 - 5.90 MIL/uL Final  . Hemoglobin 01/10/2018 14.1  13.0 - 18.0 g/dL Final  . HCT 01/10/2018 40.7  40.0 - 52.0 % Final  . MCV 01/10/2018 93.4  80.0 - 100.0 fL Final  . MCH 01/10/2018 32.3  26.0 - 34.0 pg Final  . MCHC 01/10/2018 34.6  32.0 - 36.0 g/dL Final  . RDW 01/10/2018 15.2* 11.5 - 14.5 % Final  . Platelets 01/10/2018 152  150 - 440 K/uL Final  . Neutrophils Relative % 01/10/2018 43  % Final  . Neutro Abs 01/10/2018 2.7  1.4 - 6.5 K/uL Final  . Lymphocytes Relative 01/10/2018 22  % Final  . Lymphs Abs 01/10/2018 1.3  1.0 - 3.6 K/uL Final  . Monocytes Relative 01/10/2018 17  % Final  . Monocytes Absolute 01/10/2018 1.0  0.2 - 1.0 K/uL Final  . Eosinophils Relative 01/10/2018 19  % Final  . Eosinophils Absolute 01/10/2018 1.2* 0 - 0.7 K/uL Final  . Basophils Relative 01/10/2018 1  % Final  . Basophils Absolute 01/10/2018 0.0  0 - 0.1 K/uL Final   Performed at [facility], [address] [place], [place] [address]    Assessment:  YSABEL COWGILL is a 84 y.o. male with multiple lucent lesions in the pelvis and an abnormal SPEP.  SPEP on 12/19/2017 revealed a 0.4 gm/dl monoclonal spike.  24 hour UPEP on 12/24/2017 revealed bo monoclonal protein.  Creatinine was 1.2.  Calcium and albumen were normal.  PSA was 1.38.  Abdomen and pelvis CT on 12/16/2017 revealed multiple lucent lesions throughout the pelvis measuring up to 16 mm in the left ilium.  Symptomatically, he is fatigued.  He has intermittent abdominal and hip pain.  He denies any issues with infections.  Exam is unremarkable.  Plan: 1.  Labs today:  CBC with diff, CMP, myeloma panel, FLCA, .  2.  24 hour urine for UPEP and free light chains.  3.  Lytic bone lesions:  Discuss  differential diagnosis for lytic lesions: renal cell carcinoma (no lesion on CT), multiple myeloma, non-Hodgkin's lymphoma, thyroid cancer, and melanoma.  Discuss small M-spike.  Differential diagnosis includes an monoclonal gammopathy of unknown significance (MGUS) or a lymphoproliferative disorder such as multiple myeloma.  Discuss repeating myeloma work-up with immunoglobulins, free light chains and 24 hour urine for free light chains.  Anticipate bone marrow aspirate and biopsy unless work-up uncovers other etiology. 4.  Chest CT no contrast. 5.  RTC in 1 week for MD assessment, review of work-up, and discussion regarding direction of therapy.    Lequita Asal, MD  01/10/2018, 5:35 PM

## 2018-01-10 NOTE — Progress Notes (Signed)
Patient here today as new evaluation regarding multiple lucent lesions in pelvis.  Referred by Ellin Mayhew, NP # [facility].  Patient is accompanied by his son, Alvester Chou.

## 2018-01-10 NOTE — Telephone Encounter (Signed)
Spoke with son. Informed him I spoke with Dr. Delana Meyer and we will move forward with a mesenteric angiogram. He knows to except contact from Mickel Baas to schedule the procedure.

## 2018-01-12 DIAGNOSIS — R778 Other specified abnormalities of plasma proteins: Secondary | ICD-10-CM | POA: Insufficient documentation

## 2018-01-13 ENCOUNTER — Other Ambulatory Visit: Payer: Self-pay

## 2018-01-13 ENCOUNTER — Emergency Department: Payer: Medicare Other

## 2018-01-13 ENCOUNTER — Inpatient Hospital Stay
Admission: EM | Admit: 2018-01-13 | Discharge: 2018-01-15 | DRG: 872 | Disposition: A | Discharge status: Home Health | Payer: Medicare Other | Attending: Internal Medicine | Admitting: Internal Medicine

## 2018-01-13 DIAGNOSIS — J449 Chronic obstructive pulmonary disease, unspecified: Secondary | ICD-10-CM | POA: Diagnosis present

## 2018-01-13 DIAGNOSIS — I2510 Atherosclerotic heart disease of native coronary artery without angina pectoris: Secondary | ICD-10-CM | POA: Diagnosis present

## 2018-01-13 DIAGNOSIS — E7800 Pure hypercholesterolemia, unspecified: Secondary | ICD-10-CM | POA: Diagnosis present

## 2018-01-13 DIAGNOSIS — I4891 Unspecified atrial fibrillation: Secondary | ICD-10-CM | POA: Diagnosis present

## 2018-01-13 DIAGNOSIS — R21 Rash and other nonspecific skin eruption: Secondary | ICD-10-CM | POA: Diagnosis present

## 2018-01-13 DIAGNOSIS — Z950 Presence of cardiac pacemaker: Secondary | ICD-10-CM

## 2018-01-13 DIAGNOSIS — E785 Hyperlipidemia, unspecified: Secondary | ICD-10-CM | POA: Diagnosis present

## 2018-01-13 DIAGNOSIS — A419 Sepsis, unspecified organism: Secondary | ICD-10-CM | POA: Diagnosis not present

## 2018-01-13 DIAGNOSIS — Z7902 Long term (current) use of antithrombotics/antiplatelets: Secondary | ICD-10-CM

## 2018-01-13 DIAGNOSIS — Z515 Encounter for palliative care: Secondary | ICD-10-CM

## 2018-01-13 DIAGNOSIS — Z7951 Long term (current) use of inhaled steroids: Secondary | ICD-10-CM

## 2018-01-13 DIAGNOSIS — Z952 Presence of prosthetic heart valve: Secondary | ICD-10-CM

## 2018-01-13 DIAGNOSIS — Z79899 Other long term (current) drug therapy: Secondary | ICD-10-CM

## 2018-01-13 DIAGNOSIS — I442 Atrioventricular block, complete: Secondary | ICD-10-CM | POA: Diagnosis present

## 2018-01-13 DIAGNOSIS — E872 Acidosis: Secondary | ICD-10-CM | POA: Diagnosis present

## 2018-01-13 DIAGNOSIS — Z951 Presence of aortocoronary bypass graft: Secondary | ICD-10-CM

## 2018-01-13 DIAGNOSIS — Z85828 Personal history of other malignant neoplasm of skin: Secondary | ICD-10-CM

## 2018-01-13 DIAGNOSIS — N179 Acute kidney failure, unspecified: Secondary | ICD-10-CM | POA: Diagnosis present

## 2018-01-13 DIAGNOSIS — I10 Essential (primary) hypertension: Secondary | ICD-10-CM | POA: Diagnosis present

## 2018-01-13 DIAGNOSIS — Z66 Do not resuscitate: Secondary | ICD-10-CM | POA: Diagnosis present

## 2018-01-13 DIAGNOSIS — R531 Weakness: Secondary | ICD-10-CM

## 2018-01-13 DIAGNOSIS — Z87891 Personal history of nicotine dependence: Secondary | ICD-10-CM

## 2018-01-13 DIAGNOSIS — M899 Disorder of bone, unspecified: Secondary | ICD-10-CM

## 2018-01-13 DIAGNOSIS — Z7982 Long term (current) use of aspirin: Secondary | ICD-10-CM

## 2018-01-13 DIAGNOSIS — Z23 Encounter for immunization: Secondary | ICD-10-CM

## 2018-01-13 DIAGNOSIS — Z91041 Radiographic dye allergy status: Secondary | ICD-10-CM

## 2018-01-13 LAB — CBC WITH DIFFERENTIAL/PLATELET
Basophils Absolute: 0.1 10*3/uL (ref 0–0.1)
Basophils Relative: 1 %
EOS ABS: 1 10*3/uL — AB (ref 0–0.7)
EOS PCT: 10 %
HCT: 38.9 % — ABNORMAL LOW (ref 40.0–52.0)
HEMOGLOBIN: 13.5 g/dL (ref 13.0–18.0)
LYMPHS ABS: 0.4 10*3/uL — AB (ref 1.0–3.6)
Lymphocytes Relative: 4 %
MCH: 31.9 pg (ref 26.0–34.0)
MCHC: 34.8 g/dL (ref 32.0–36.0)
MCV: 91.7 fL (ref 80.0–100.0)
MONOS PCT: 9 %
Monocytes Absolute: 0.9 10*3/uL (ref 0.2–1.0)
Neutro Abs: 7.5 10*3/uL — ABNORMAL HIGH (ref 1.4–6.5)
Neutrophils Relative %: 76 %
PLATELETS: 122 10*3/uL — AB (ref 150–440)
RBC: 4.25 MIL/uL — ABNORMAL LOW (ref 4.40–5.90)
RDW: 15.4 % — ABNORMAL HIGH (ref 11.5–14.5)
WBC: 9.7 10*3/uL (ref 3.8–10.6)

## 2018-01-13 LAB — KAPPA/LAMBDA LIGHT CHAINS
Kappa free light chain: 71.7 mg/L — ABNORMAL HIGH (ref 3.3–19.4)
Kappa, lambda light chain ratio: 1.76 — ABNORMAL HIGH (ref 0.26–1.65)
Lambda free light chains: 40.7 mg/L — ABNORMAL HIGH (ref 5.7–26.3)

## 2018-01-13 LAB — COMPREHENSIVE METABOLIC PANEL
ALT: 19 U/L (ref 0–44)
AST: 29 U/L (ref 15–41)
Albumin: 3.5 g/dL (ref 3.5–5.0)
Alkaline Phosphatase: 96 U/L (ref 38–126)
Anion gap: 8 (ref 5–15)
BUN: 24 mg/dL — AB (ref 8–23)
CO2: 23 mmol/L (ref 22–32)
CREATININE: 1.28 mg/dL — AB (ref 0.61–1.24)
Calcium: 8.5 mg/dL — ABNORMAL LOW (ref 8.9–10.3)
Chloride: 104 mmol/L (ref 98–111)
GFR calc Af Amer: 52 mL/min — ABNORMAL LOW (ref >60)
GFR calc non Af Amer: 45 mL/min — ABNORMAL LOW (ref >60)
GLUCOSE: 109 mg/dL — AB (ref 70–99)
Potassium: 4 mmol/L (ref 3.5–5.1)
SODIUM: 135 mmol/L (ref 135–145)
Total Bilirubin: 1.2 mg/dL (ref 0.3–1.2)
Total Protein: 7 g/dL (ref 6.5–8.1)

## 2018-01-13 LAB — MULTIPLE MYELOMA PANEL, SERUM
Albumin SerPl Elph-Mcnc: 3.4 g/dL (ref 2.9–4.4)
Albumin/Glob SerPl: 1 (ref 0.7–1.7)
Alpha 1: 0.3 g/dL (ref 0.0–0.4)
Alpha2 Glob SerPl Elph-Mcnc: 0.8 g/dL (ref 0.4–1.0)
B-Globulin SerPl Elph-Mcnc: 1.1 g/dL (ref 0.7–1.3)
Gamma Glob SerPl Elph-Mcnc: 1.4 g/dL (ref 0.4–1.8)
Globulin, Total: 3.6 g/dL (ref 2.2–3.9)
IgA: 245 mg/dL (ref 61–437)
IgG (Immunoglobin G), Serum: 1452 mg/dL (ref 700–1600)
IgM (Immunoglobulin M), Srm: 116 mg/dL (ref 15–143)
M Protein SerPl Elph-Mcnc: 0.4 g/dL — ABNORMAL HIGH
Total Protein ELP: 7 g/dL (ref 6.0–8.5)

## 2018-01-13 LAB — LACTIC ACID, PLASMA: LACTIC ACID, VENOUS: 2.4 mmol/L — AB (ref 0.5–1.9)

## 2018-01-13 MED ORDER — VANCOMYCIN HCL IN DEXTROSE 1-5 GM/200ML-% IV SOLN
1000.0000 mg | Freq: Once | INTRAVENOUS | Status: AC
  Administered 2018-01-14: 1000 mg via INTRAVENOUS
  Filled 2018-01-13 (×2): qty 200

## 2018-01-13 MED ORDER — SODIUM CHLORIDE 0.9 % IV BOLUS
1000.0000 mL | Freq: Once | INTRAVENOUS | Status: AC
  Administered 2018-01-14: 1000 mL via INTRAVENOUS

## 2018-01-13 MED ORDER — METRONIDAZOLE IN NACL 5-0.79 MG/ML-% IV SOLN
500.0000 mg | Freq: Three times a day (TID) | INTRAVENOUS | Status: DC
  Administered 2018-01-14 – 2018-01-15 (×5): 500 mg via INTRAVENOUS
  Filled 2018-01-13 (×6): qty 100

## 2018-01-13 MED ORDER — SODIUM CHLORIDE 0.9 % IV SOLN
2.0000 g | Freq: Once | INTRAVENOUS | Status: AC
  Administered 2018-01-14: 2 g via INTRAVENOUS
  Filled 2018-01-13: qty 2

## 2018-01-13 NOTE — ED Provider Notes (Signed)
[facility] Emergency Department Provider Note  Time seen: 10:14 PM  I have reviewed the triage vital signs and the nursing notes.   HISTORY  Chief Complaint Fever    HPI Larry Alexander is a 84 y.o. male with a past medical history of CHF, atrial fibrillation, COPD, CAD, hypertension, hyperlipidemia, presents to the emergency department from home for fever and weakness as well as increased confusion.  According to EMS family states the patient is getting over a recent urinary tract infection however tonight they noted the patient to feel very warm as if he had a fever, was also increasingly weak and appeared to be more confused.  Does have a history of dementia per report but appeared to be more confused than normal.  Here the patient is awake and alert, he knows his name and where he is at, but is mildly confused.  Does not know why he is here.   Past Medical History:  Diagnosis Date  . Acute gout   . Aortic stenosis   . Asthma   . Atrial fibrillation ([place])   . Cancer ([place])    basal cell ca face  . CHF (congestive heart failure) ([place])   . Chronic kidney disease (CKD)   . COPD (chronic obstructive pulmonary disease) ([place])   . Coronary artery disease   . High cholesterol   . Hypertension   . Intermittent complete heart block ([place])   . S/P TAVR (transcatheter aortic valve replacement)     Patient Active Problem List   Diagnosis Date Noted  . Abnormal SPEP 01/12/2018  . Lytic bone lesions on xray 01/10/2018  . 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 Aortic atherosclerosis ([place]) 01/06/2018  . Hyperlipidemia 01/06/2018  . Essential hypertension 01/06/2018    Past Surgical History:  Procedure Laterality Date  . AORTIC VALVE SURGERY    . CORONARY ARTERY BYPASS GRAFT    . PACEMAKER INSERTION    . THROMBOENDARTERECTOMY FEMORAL ARTERY    . triple bypass      Prior to Admission medications   Medication Sig Start Date End Date Taking? Authorizing  Provider  acetaminophen (TYLENOL) 500 MG tablet Take 500-1,000 mg by mouth every 6 (six) hours as needed for mild pain or moderate pain.    [provider]  albuterol (PROVENTIL HFA;VENTOLIN HFA) 108 (90 Base) MCG/ACT inhaler Inhale 2 puffs into the lungs every 6 (six) hours as needed for wheezing or shortness of breath.    [provider]  aspirin EC 81 MG tablet Take 81 mg by mouth daily.    [provider]  atorvastatin (LIPITOR) 20 MG tablet Take 20 mg by mouth daily.    [provider]  clopidogrel (PLAVIX) 75 MG tablet Take by mouth.    [provider]  digoxin (LANOXIN) 0.125 MG tablet Take 0.0625 mg by mouth every other day.    [provider]  diphenhydrAMINE (BENADRYL) 25 MG tablet Take 25-50 mg by mouth at bedtime as needed for itching ((itchy rash caused by contrast media)).    [provider]  finasteride (PROSCAR) 5 MG tablet Take 5 mg by mouth daily.    [provider]  Fluticasone-Salmeterol (ADVAIR) 100-50 MCG/DOSE AEPB Inhale 1 puff into the lungs 2 (two) times daily.    [provider]  hydrocortisone cream 1 % Apply 1 application topically as directed. (for rash caused by contrast media)    [provider]  metoprolol tartrate (LOPRESSOR) 25 MG tablet Take  25 mg by mouth 2 (two) times daily.    [provider]  omeprazole (PRILOSEC) 20 MG capsule Take 20 mg by mouth daily.    [provider]  polyethylene glycol (MIRALAX / GLYCOLAX) packet Take 17 g by mouth daily as needed for mild constipation or moderate constipation.    [provider]  tamsulosin (FLOMAX) 0.4 MG CAPS capsule Take 0.4 mg by mouth every evening.     [provider]    Allergies  Allergen Reactions  . Contrast Media [Iodinated Diagnostic Agents]     Oral and IV dye    Family History  Problem Relation Age of Onset  . Cancer Mother     Social History Social History   Tobacco  Use  . Smoking status: Former Research scientist (life sciences)  . Smokeless tobacco: Never Used  . Tobacco comment: 2 cigarettes every other week - socially  Substance Use Topics  . Alcohol use: Not Currently  . Drug use: Never    Review of Systems Unable to obtain an adequate/accurate review of systems secondary to confusion/altered mental status.  ____________________________________________   PHYSICAL EXAM:  VITAL SIGNS: ED Triage Vitals  Enc Vitals Group     BP 01/13/18 2143 (!) 112/54     Pulse Rate 01/13/18 2143 83     Resp --      Temp 01/13/18 2143 (!) 102 F (38.9 C)     Temp Source 01/13/18 2143 Oral     SpO2 01/13/18 2142 98 %     Weight 01/13/18 2144 150 lb (68 kg)     Height 01/13/18 2144 5\' 6"  (1.676 m)     Head Circumference --      Peak Flow --      Pain Score 01/13/18 2144 0     Pain Loc --      Pain Edu? --      Excl. in [place]? --     Constitutional: Patient is awake and alert, oriented x2, overall well-appearing and in no distress lying comfortably in bed. Eyes: Normal exam ENT   Head: Normocephalic and atraumatic   Mouth/Throat: Somewhat dry appearing mucous membranes. Cardiovascular: Normal rate, regular rhythm.  Respiratory: Normal respiratory effort without tachypnea nor retractions. Breath sounds are clear, without obvious wheeze rales or rhonchi. Gastrointestinal: Soft and nontender. No distention.  Musculoskeletal: Nontender with normal range of motion in all extremities. Neurologic:  Normal speech and language. No gross focal neurologic deficits Skin:  Skin is warm, dry and intact.  Psychiatric: Mood and affect are normal.   ____________________________________________    EKG  EKG reviewed and interpreted by myself shows an atrial sensed ventricular paced rhythm 81 bpm with a widened QRS, nonspecific ST changes.  ____________________________________________    RADIOLOGY  Chest x-ray largely  negative  ____________________________________________   INITIAL IMPRESSION / ASSESSMENT AND PLAN / ED COURSE  Pertinent labs & imaging results that were available during my care of the patient were reviewed by me and considered in my medical decision making (see chart for details).  Patient presents to the emergency department for fever weakness and confusion.  Differential would include infectious etiology such as pneumonia, urinary tract infection, URI or viral infection.  Patient is likely septic given his temperature of 102.  We will check labs including blood cultures, lactic acid, will start the patient on broad-spectrum antibiotics given his confusion weakness and febrile to 102.  Patient agreeable to plan of care.  We will check labs, chest x-ray, urinalysis.  CRITICAL CARE Performed by: Harvest Dark   Total critical care time: 30 minutes  Critical care time was exclusive of separately billable procedures and treating other patients.  Critical care was necessary to treat or prevent imminent or life-threatening deterioration.  Critical care was time spent personally by me on the following activities: development of treatment plan with patient and/or surrogate as well as nursing, discussions with consultants, evaluation of patient's response to treatment, examination of patient, obtaining history from patient or surrogate, ordering and performing treatments and interventions, ordering and review of laboratory studies, ordering and review of radiographic studies, pulse oximetry and re-evaluation of patient's condition.   ____________________________________________   FINAL CLINICAL IMPRESSION(S) / ED DIAGNOSES  Sepsis Fever Altered mental status    Harvest Dark, MD 01/15/18 [phone]

## 2018-01-13 NOTE — ED Triage Notes (Addendum)
Pt brought in by EMS for new weakness, fever, and confusion. EMS gave 250 bolus of NS. Family states pt recently had UTI.

## 2018-01-14 ENCOUNTER — Ambulatory Visit: Admission: RE | Admit: 2018-01-14 | Payer: Medicare Other | Source: Ambulatory Visit

## 2018-01-14 ENCOUNTER — Telehealth: Payer: Self-pay | Admitting: *Deleted

## 2018-01-14 DIAGNOSIS — Z952 Presence of prosthetic heart valve: Secondary | ICD-10-CM | POA: Diagnosis not present

## 2018-01-14 DIAGNOSIS — I4891 Unspecified atrial fibrillation: Secondary | ICD-10-CM | POA: Diagnosis present

## 2018-01-14 DIAGNOSIS — E7800 Pure hypercholesterolemia, unspecified: Secondary | ICD-10-CM | POA: Diagnosis present

## 2018-01-14 DIAGNOSIS — Z66 Do not resuscitate: Secondary | ICD-10-CM | POA: Diagnosis present

## 2018-01-14 DIAGNOSIS — Z23 Encounter for immunization: Secondary | ICD-10-CM | POA: Diagnosis not present

## 2018-01-14 DIAGNOSIS — I10 Essential (primary) hypertension: Secondary | ICD-10-CM | POA: Diagnosis present

## 2018-01-14 DIAGNOSIS — E872 Acidosis: Secondary | ICD-10-CM | POA: Diagnosis present

## 2018-01-14 DIAGNOSIS — Z7902 Long term (current) use of antithrombotics/antiplatelets: Secondary | ICD-10-CM | POA: Diagnosis not present

## 2018-01-14 DIAGNOSIS — Z79899 Other long term (current) drug therapy: Secondary | ICD-10-CM | POA: Diagnosis not present

## 2018-01-14 DIAGNOSIS — Z951 Presence of aortocoronary bypass graft: Secondary | ICD-10-CM | POA: Diagnosis not present

## 2018-01-14 DIAGNOSIS — Z515 Encounter for palliative care: Secondary | ICD-10-CM | POA: Diagnosis not present

## 2018-01-14 DIAGNOSIS — A419 Sepsis, unspecified organism: Secondary | ICD-10-CM | POA: Diagnosis present

## 2018-01-14 DIAGNOSIS — I2510 Atherosclerotic heart disease of native coronary artery without angina pectoris: Secondary | ICD-10-CM | POA: Diagnosis present

## 2018-01-14 DIAGNOSIS — Z91041 Radiographic dye allergy status: Secondary | ICD-10-CM | POA: Diagnosis not present

## 2018-01-14 DIAGNOSIS — Z87891 Personal history of nicotine dependence: Secondary | ICD-10-CM | POA: Diagnosis not present

## 2018-01-14 DIAGNOSIS — J449 Chronic obstructive pulmonary disease, unspecified: Secondary | ICD-10-CM | POA: Diagnosis present

## 2018-01-14 DIAGNOSIS — R531 Weakness: Secondary | ICD-10-CM | POA: Diagnosis not present

## 2018-01-14 DIAGNOSIS — I442 Atrioventricular block, complete: Secondary | ICD-10-CM | POA: Diagnosis present

## 2018-01-14 DIAGNOSIS — Z7951 Long term (current) use of inhaled steroids: Secondary | ICD-10-CM | POA: Diagnosis not present

## 2018-01-14 DIAGNOSIS — N179 Acute kidney failure, unspecified: Secondary | ICD-10-CM | POA: Diagnosis present

## 2018-01-14 DIAGNOSIS — Z85828 Personal history of other malignant neoplasm of skin: Secondary | ICD-10-CM | POA: Diagnosis not present

## 2018-01-14 DIAGNOSIS — Z7982 Long term (current) use of aspirin: Secondary | ICD-10-CM | POA: Diagnosis not present

## 2018-01-14 DIAGNOSIS — Z950 Presence of cardiac pacemaker: Secondary | ICD-10-CM | POA: Diagnosis not present

## 2018-01-14 DIAGNOSIS — R21 Rash and other nonspecific skin eruption: Secondary | ICD-10-CM | POA: Diagnosis present

## 2018-01-14 LAB — URINALYSIS, ROUTINE W REFLEX MICROSCOPIC
BILIRUBIN URINE: NEGATIVE
Glucose, UA: NEGATIVE mg/dL
Hgb urine dipstick: NEGATIVE
KETONES UR: NEGATIVE mg/dL
Leukocytes, UA: NEGATIVE
Nitrite: NEGATIVE
Protein, ur: 30 mg/dL — AB
SPECIFIC GRAVITY, URINE: 1.025 (ref 1.005–1.030)
SQUAMOUS EPITHELIAL / LPF: NONE SEEN (ref 0–5)
pH: 5 (ref 5.0–8.0)

## 2018-01-14 LAB — IFE+PROTEIN ELECTRO, 24-HR UR
% BETA, Urine: 26.2 %
ALBUMIN, U: 31.3 %
ALPHA 1 URINE: 2.6 %
Alpha 2, Urine: 19 %
GAMMA GLOBULIN URINE: 21 %
TOTAL PROTEIN, URINE-UPE24: 33.6 mg/dL
Total Protein, Urine-Ur/day: 118 mg/24 hr (ref 30–150)
Total Volume: 350

## 2018-01-14 LAB — BASIC METABOLIC PANEL
ANION GAP: 5 (ref 5–15)
BUN: 24 mg/dL — ABNORMAL HIGH (ref 8–23)
CHLORIDE: 110 mmol/L (ref 98–111)
CO2: 21 mmol/L — AB (ref 22–32)
Calcium: 7.4 mg/dL — ABNORMAL LOW (ref 8.9–10.3)
Creatinine, Ser: 1.25 mg/dL — ABNORMAL HIGH (ref 0.61–1.24)
GFR calc non Af Amer: 46 mL/min — ABNORMAL LOW (ref >60)
GFR, EST AFRICAN AMERICAN: 54 mL/min — AB (ref >60)
GLUCOSE: 135 mg/dL — AB (ref 70–99)
POTASSIUM: 3.5 mmol/L (ref 3.5–5.1)
Sodium: 136 mmol/L (ref 135–145)

## 2018-01-14 LAB — CBC
HEMATOCRIT: 31.1 % — AB (ref 40.0–52.0)
HEMOGLOBIN: 10.9 g/dL — AB (ref 13.0–18.0)
MCH: 32.5 pg (ref 26.0–34.0)
MCHC: 35.1 g/dL (ref 32.0–36.0)
MCV: 92.8 fL (ref 80.0–100.0)
Platelets: 107 10*3/uL — ABNORMAL LOW (ref 150–440)
RBC: 3.35 MIL/uL — AB (ref 4.40–5.90)
RDW: 15.5 % — ABNORMAL HIGH (ref 11.5–14.5)
WBC: 9.6 10*3/uL (ref 3.8–10.6)

## 2018-01-14 LAB — LACTIC ACID, PLASMA: LACTIC ACID, VENOUS: 1.6 mmol/L (ref 0.5–1.9)

## 2018-01-14 MED ORDER — ACETAMINOPHEN 500 MG PO TABS
1000.0000 mg | ORAL_TABLET | Freq: Once | ORAL | Status: AC
  Administered 2018-01-14: 1000 mg via ORAL
  Filled 2018-01-14: qty 2

## 2018-01-14 MED ORDER — MOMETASONE FURO-FORMOTEROL FUM 100-5 MCG/ACT IN AERO
2.0000 | INHALATION_SPRAY | Freq: Two times a day (BID) | RESPIRATORY_TRACT | Status: DC
  Administered 2018-01-14 – 2018-01-15 (×2): 2 via RESPIRATORY_TRACT
  Filled 2018-01-14: qty 8.8

## 2018-01-14 MED ORDER — SODIUM CHLORIDE 0.9 % IV SOLN
1.0000 g | Freq: Two times a day (BID) | INTRAVENOUS | Status: DC
  Administered 2018-01-14 – 2018-01-15 (×2): 1 g via INTRAVENOUS
  Filled 2018-01-14 (×4): qty 1

## 2018-01-14 MED ORDER — FINASTERIDE 5 MG PO TABS
5.0000 mg | ORAL_TABLET | Freq: Every day | ORAL | Status: DC
  Administered 2018-01-14 – 2018-01-15 (×2): 5 mg via ORAL
  Filled 2018-01-14 (×2): qty 1

## 2018-01-14 MED ORDER — PANTOPRAZOLE SODIUM 40 MG PO TBEC
40.0000 mg | DELAYED_RELEASE_TABLET | Freq: Every day | ORAL | Status: DC
  Administered 2018-01-14 – 2018-01-15 (×2): 40 mg via ORAL
  Filled 2018-01-14 (×2): qty 1

## 2018-01-14 MED ORDER — DIGOXIN 125 MCG PO TABS
0.0625 mg | ORAL_TABLET | ORAL | Status: DC
  Administered 2018-01-14: 0.0625 mg via ORAL
  Filled 2018-01-14: qty 0.5

## 2018-01-14 MED ORDER — TAMSULOSIN HCL 0.4 MG PO CAPS
0.4000 mg | ORAL_CAPSULE | Freq: Every evening | ORAL | Status: DC
Start: 2018-01-14 — End: 2018-01-15
  Administered 2018-01-14: 0.4 mg via ORAL
  Filled 2018-01-14: qty 1

## 2018-01-14 MED ORDER — SODIUM CHLORIDE 0.9 % IV SOLN
INTRAVENOUS | Status: AC
  Administered 2018-01-14: 14:00:00 via INTRAVENOUS

## 2018-01-14 MED ORDER — ONDANSETRON HCL 4 MG/2ML IJ SOLN
4.0000 mg | Freq: Four times a day (QID) | INTRAMUSCULAR | Status: DC | PRN
  Administered 2018-01-14: 4 mg via INTRAVENOUS
  Filled 2018-01-14: qty 2

## 2018-01-14 MED ORDER — ENOXAPARIN SODIUM 30 MG/0.3ML ~~LOC~~ SOLN
30.0000 mg | SUBCUTANEOUS | Status: DC
  Administered 2018-01-14: 30 mg via SUBCUTANEOUS
  Filled 2018-01-14: qty 0.3

## 2018-01-14 MED ORDER — ACETAMINOPHEN 325 MG PO TABS
650.0000 mg | ORAL_TABLET | Freq: Four times a day (QID) | ORAL | Status: DC | PRN
  Administered 2018-01-14 – 2018-01-15 (×2): 650 mg via ORAL
  Filled 2018-01-14 (×2): qty 2

## 2018-01-14 MED ORDER — ENSURE ENLIVE PO LIQD
237.0000 mL | ORAL | Status: DC
  Administered 2018-01-14: 237 mL via ORAL

## 2018-01-14 MED ORDER — ACETAMINOPHEN 650 MG RE SUPP: 650.0000 mg | Freq: Four times a day (QID) | RECTAL | Status: DC | PRN

## 2018-01-14 MED ORDER — INFLUENZA VAC SPLIT HIGH-DOSE 0.5 ML IM SUSY
0.5000 mL | PREFILLED_SYRINGE | INTRAMUSCULAR | Status: DC
  Filled 2018-01-14: qty 0.5

## 2018-01-14 MED ORDER — SODIUM CHLORIDE 0.9 % IV SOLN
INTRAVENOUS | Status: AC
  Administered 2018-01-14: 03:00:00 via INTRAVENOUS

## 2018-01-14 MED ORDER — ASPIRIN EC 81 MG PO TBEC
81.0000 mg | DELAYED_RELEASE_TABLET | Freq: Every day | ORAL | Status: DC
  Administered 2018-01-14 – 2018-01-15 (×2): 81 mg via ORAL
  Filled 2018-01-14 (×2): qty 1

## 2018-01-14 MED ORDER — ATORVASTATIN CALCIUM 20 MG PO TABS
20.0000 mg | ORAL_TABLET | Freq: Every day | ORAL | Status: DC
  Administered 2018-01-14: 20 mg via ORAL
  Filled 2018-01-14: qty 1

## 2018-01-14 MED ORDER — VANCOMYCIN HCL IN DEXTROSE 1-5 GM/200ML-% IV SOLN
1000.0000 mg | INTRAVENOUS | Status: DC
  Administered 2018-01-14: 1000 mg via INTRAVENOUS
  Filled 2018-01-14 (×2): qty 200

## 2018-01-14 MED ORDER — ONDANSETRON HCL 4 MG PO TABS: 4.0000 mg | ORAL_TABLET | Freq: Four times a day (QID) | ORAL | Status: DC | PRN

## 2018-01-14 MED ORDER — METOPROLOL TARTRATE 25 MG PO TABS
25.0000 mg | ORAL_TABLET | Freq: Two times a day (BID) | ORAL | Status: DC
  Administered 2018-01-14: 25 mg via ORAL
  Filled 2018-01-14: qty 1

## 2018-01-14 MED ORDER — CLOPIDOGREL BISULFATE 75 MG PO TABS
75.0000 mg | ORAL_TABLET | Freq: Every day | ORAL | Status: DC
  Administered 2018-01-14 – 2018-01-15 (×2): 75 mg via ORAL
  Filled 2018-01-14 (×2): qty 1

## 2018-01-14 NOTE — H&P (Signed)
[place] [facility] [place] NAME: Larry Alexander    MR#:  [id number]  DATE OF BIRTH:  1918/01/04  DATE OF ADMISSION:  01/13/2018  PRIMARY CARE PHYSICIAN: Juluis Pitch, MD   REQUESTING/REFERRING PHYSICIAN: Kerman Passey, MD  CHIEF COMPLAINT:   Chief Complaint  Patient presents with  . Fever    HISTORY OF PRESENT ILLNESS:  Larry Alexander  is a 84 y.o. male who presents with chief complaint as above.  Patient has had decreased appetite for the past couple of days.  Today he developed a fever and was significantly weaker than normal.  He was brought by his son to the ED for evaluation.  Here he was found to meet sepsis criteria with fever and elevated lactic acid, but with unclear source of infection.  He was started on antibiotics and hospitalist were called for admission.  PAST MEDICAL HISTORY:   Past Medical History:  Diagnosis Date  . Acute gout   . Aortic stenosis   . Asthma   . Atrial fibrillation ([place])   . Cancer ([place])    basal cell ca face  . CHF (congestive heart failure) ([place])   . Chronic kidney disease (CKD)   . COPD (chronic obstructive pulmonary disease) ([place])   . Coronary artery disease   . High cholesterol   . Hypertension   . Intermittent complete heart block ([place])   . S/P TAVR (transcatheter aortic valve replacement)      PAST SURGICAL HISTORY:   Past Surgical History:  Procedure Laterality Date  . AORTIC VALVE SURGERY    . CORONARY ARTERY BYPASS GRAFT    . PACEMAKER INSERTION    . THROMBOENDARTERECTOMY FEMORAL ARTERY    . triple bypass       SOCIAL HISTORY:   Social History   Tobacco Use  . Smoking status: Former Research scientist (life sciences)  . Smokeless tobacco: Never Used  . Tobacco comment: 2 cigarettes every other week - socially  Substance Use Topics  . Alcohol use: Not Currently     FAMILY HISTORY:   Family History  Problem Relation Age of Onset  . Cancer Mother      DRUG ALLERGIES:   Allergies  Allergen  Reactions  . Contrast Media [Iodinated Diagnostic Agents]     Oral and IV dye    MEDICATIONS AT HOME:   Prior to Admission medications   Medication Sig Start Date End Date Taking? Authorizing Provider  acetaminophen (TYLENOL) 500 MG tablet Take 500-1,000 mg by mouth every 6 (six) hours as needed for mild pain or moderate pain.   Yes [provider]  albuterol (PROVENTIL HFA;VENTOLIN HFA) 108 (90 Base) MCG/ACT inhaler Inhale 2 puffs into the lungs every 6 (six) hours as needed for wheezing or shortness of breath.   Yes [provider]  aspirin EC 81 MG tablet Take 81 mg by mouth daily.   Yes [provider]  atorvastatin (LIPITOR) 20 MG tablet Take 20 mg by mouth daily.   Yes [provider]  clopidogrel (PLAVIX) 75 MG tablet Take 75 mg by mouth daily.    Yes [provider]  digoxin (LANOXIN) 0.125 MG tablet Take 0.0625 mg by mouth every other day.   Yes [provider]  diphenhydrAMINE (BENADRYL) 25 MG tablet Take 25-50 mg by mouth at bedtime as needed for itching.    Yes [provider]  finasteride (PROSCAR) 5 MG tablet Take 5 mg by mouth daily.   Yes [provider]  Fluticasone-Salmeterol (ADVAIR) 100-50 MCG/DOSE AEPB Inhale 1 puff into the lungs 2 (two) times daily.   Yes [provider]  hydrocortisone cream 1 % Apply 1 application topically as needed for itching.    Yes [provider]  metoprolol tartrate (LOPRESSOR) 25 MG tablet Take 25 mg by mouth 2 (two) times daily.   Yes [provider]  omeprazole (PRILOSEC) 20 MG capsule Take 20 mg by mouth daily.   Yes [provider]  polyethylene glycol (MIRALAX / GLYCOLAX) packet Take 17 g by mouth daily as needed for mild constipation or moderate constipation.   Yes [provider]  tamsulosin (FLOMAX) 0.4 MG CAPS capsule Take 0.4 mg by mouth every evening.    Yes [provider]    REVIEW OF SYSTEMS:  Review of  Systems  Constitutional: Positive for chills, fever and malaise/fatigue. Negative for weight loss.  HENT: Negative for ear pain, hearing loss and tinnitus.   Eyes: Negative for blurred vision, double vision, pain and redness.  Respiratory: Negative for cough, hemoptysis and shortness of breath.   Cardiovascular: Negative for chest pain, palpitations, orthopnea and leg swelling.  Gastrointestinal: Negative for abdominal pain, constipation, diarrhea, nausea and vomiting.  Genitourinary: Negative for dysuria, frequency and hematuria.  Musculoskeletal: Negative for back pain, joint pain and neck pain.  Skin:       No acne, rash, or lesions  Neurological: Negative for dizziness, tremors, focal weakness and weakness.  Endo/Heme/Allergies: Negative for polydipsia. Does not bruise/bleed easily.  Psychiatric/Behavioral: Negative for depression. The patient is not nervous/anxious and does not have insomnia.      VITAL SIGNS:   Vitals:   01/13/18 2142 01/13/18 2143 01/13/18 2144  BP:  (!) 112/54   Pulse:  83   Temp:  (!) 102 F (38.9 C)   TempSrc:  Oral   SpO2: 98% 95%   Weight:   68 kg  Height:   5\' 6"  (1.676 m)   Wt Readings from Last 3 Encounters:  01/13/18 68 kg  01/10/18 72.3 kg  01/06/18 72.6 kg    PHYSICAL EXAMINATION:  Physical Exam  Vitals reviewed. Constitutional: He is oriented to person, place, and time. He appears well-developed and well-nourished. No distress.  HENT:  Head: Normocephalic and atraumatic.  Mouth/Throat: Oropharynx is clear and moist.  Eyes: Pupils are equal, round, and reactive to light. Conjunctivae and EOM are normal. No scleral icterus.  Neck: Normal range of motion. Neck supple. No JVD present. No thyromegaly present.  Cardiovascular: Normal rate, regular rhythm and intact distal pulses. Exam reveals no gallop and no friction rub.  No murmur heard. Respiratory: Effort normal and breath sounds normal. No respiratory distress. He has no wheezes. He  has no rales.  GI: Soft. Bowel sounds are normal. He exhibits no distension. There is no tenderness.  Musculoskeletal: Normal range of motion. He exhibits no edema.  No arthritis, no gout  Lymphadenopathy:    He has no cervical adenopathy.  Neurological: He is alert and oriented to person, place, and time. No cranial nerve deficit.  No dysarthria, no aphasia  Skin: Skin is warm and dry. No rash noted. No erythema.  Psychiatric: He has a normal mood and affect. His behavior is normal. Judgment and thought content normal.    LABORATORY PANEL:   CBC Recent Labs  Lab 01/13/18 2219  WBC 9.7  HGB 13.5  HCT 38.9*  PLT 122*   ------------------------------------------------------------------------------------------------------------------  Chemistries  Recent Labs  Lab 01/13/18 2219  NA  135  K 4.0  CL 104  CO2 23  GLUCOSE 109*  BUN 24*  CREATININE 1.28*  CALCIUM 8.5*  AST 29  ALT 19  ALKPHOS 96  BILITOT 1.2   ------------------------------------------------------------------------------------------------------------------  Cardiac Enzymes No results for input(s): TROPONINI in the last 168 hours. ------------------------------------------------------------------------------------------------------------------  RADIOLOGY:  Dg Chest Port 1 View  Result Date: 01/13/2018 CLINICAL DATA:  Fever, weakness. EXAM: PORTABLE CHEST 1 VIEW COMPARISON:  Chest radiograph October 10, 2013 FINDINGS: Cardiac silhouette is normal in size. Status post median sternotomy CABG. Calcified aortic arch. Dual lead LEFT cardiac pacemaker in situ. Bibasilar strandy densities. No pleural effusion or focal consolidation. Nipple shadow RIGHT lung base. No pneumothorax. Soft tissue planes and included osseous structures are non suspicious. Small hiatal hernia. IMPRESSION: Bibasilar atelectasis/scarring. Electronically Signed   By: Elon Alas M.D.   On: 01/13/2018 22:34    EKG:   Orders placed or  performed during the hospital encounter of 01/13/18  . ED EKG 12-Lead  . ED EKG 12-Lead    IMPRESSION AND PLAN:  Principal Problem:   Sepsis ([place]) -unclear source of infection, broad-spectrum antibiotics initiated, lactic acid is mildly elevated so we will initiate IV fluids and monitor until within normal limits.  Blood pressure stable.  Cultures sent. Active Problems:   Essential hypertension -continue home meds   CAD (coronary artery disease) -continue home dose cardiac medications   COPD (chronic obstructive pulmonary disease) ([facility]) -home dose inhalers   Hyperlipidemia -Home dose antilipid  Chart review performed and case discussed with ED provider. Labs, imaging and/or ECG reviewed by provider and discussed with patient/family. Management plans discussed with the patient and/or family.  DVT PROPHYLAXIS: SubQ lovenox   GI PROPHYLAXIS:  PPI   ADMISSION STATUS: Inpatient     CODE STATUS: Full  TOTAL TIME TAKING CARE OF THIS PATIENT: 45 minutes.   Soham Hollett FIELDING 01/14/2018, 12:58 AM  [facility]  Office  [phone]  CC: Primary care physician; Juluis Pitch, MD  Note:  This document was prepared using Dragon voice recognition software and may include unintentional dictation errors.

## 2018-01-14 NOTE — Progress Notes (Signed)
Advanced Care Plan.  Purpose of Encounter: CODE STATUS. Parties in Attendance: The patient, his son and me. Patient's Decisional Capacity: No. Medical Story: Trigger Larry Alexander  is a 84 y.o. male who presents with chief complaint as above.  Patient has had decreased appetite for the past couple of days. He developed a fever and was significantly weaker than normal.  He is admitted for sepsis, dehydration and the generalized weakness.  Per his son, he is condition has been declining recently.  I discussed with the patient, his son about his current condition, poor prognosis and CODE STATUS.  His son (POA) cannot make a decision now and will discuss with other family members to decide DNR or full code. Goals of Care Determinations: Palliative care. Plan:  Code Status: Full code for now. Time spent discussing advance care planning: 18 minutes.

## 2018-01-14 NOTE — Progress Notes (Signed)
Initial Nutrition Assessment  DOCUMENTATION CODES:   Not applicable  INTERVENTION:  Ensure Enlive to provide 350kcal 20g protein Magic Cup to provide 290kcal 9g protein   NUTRITION DIAGNOSIS:   Inadequate oral intake related to decreased appetite, altered GI function, chronic illness(chronic mesenteric ischemia dx on 9/5) as evidenced by energy intake < or equal to 50% for > or equal to 1 month, per patient/family report.   GOAL:   Patient will meet greater than or equal to 90% of their needs   MONITOR:   PO intake, Supplement acceptance  REASON FOR ASSESSMENT:   Malnutrition Screening Tool    ASSESSMENT:   Pt admitted 9/23 for possible sepsis, 2-3 days of decreased appetite, high fever, and generalized weakness.    PMH: HTN, CAD, CKD, CHF, pacemaker, aortic valve surgery.  Previous ED 9/5: Per MD; abdominal pain/decreased appetite x44month d/t chronic mesenterica ischemia  Pt awake and smiling at visit and son at bedside. Son stated pt has short term memory loss and dementia; reports recall questions.  would be difficult for pt. Son reports pt eating 55% of breakfast. PTA son states pt has experienced loss of appetite for the past 7 weeks.  Son attributes pt LOA to mesenteric ischemia and is concerned about pt receiving enough protein and calories. Son reports meal intake PTA is about 30% Son states pt likes [employer] in the morning and enjoys Boost mixed w/icecream at home once a day. Pt reports favorite food as ice cream.  Both son and pt agreeable to having magic cup and boost during LOS.   Medication Reviewed: Protonix, Flagyl, Vancomycin  Labs Reviewed: Glucose 135 (H), BUN 24 (H) Cr 1.25 (H),    NUTRITION - FOCUSED PHYSICAL EXAM:    Most Recent Value  Orbital Region  No depletion  Upper Arm Region  Mild depletion  Thoracic and Lumbar Region  No depletion  Buccal Region  Mild depletion  Temple Region  Mild depletion  Clavicle Bone Region   No depletion  Clavicle and Acromion Bone Region  No depletion  Scapular Bone Region  No depletion  Dorsal Hand  Mild depletion  Patellar Region  No depletion  Anterior Thigh Region  No depletion  Posterior Calf Region  No depletion  Edema (RD Assessment)  None  Hair  Reviewed  Eyes  Reviewed  Mouth  Reviewed  Skin  Reviewed  Nails  Reviewed       Diet Order:   Diet Order            Diet Heart Room service appropriate? Yes; Fluid consistency: Thin  Diet effective now              EDUCATION NEEDS:   Education needs have been addressed  Skin:  Skin Assessment: Skin Integrity Issues:(BL ecchymosis, dry) Skin Integrity Issues:: Other (Comment)(rash to bilateral posterior knee) Other: son reports rash appeared after fever spike and improving  Last BM:  9/22; WDL(Per 9/24 RN note)  Height:   Ht Readings from Last 1 Encounters:  01/14/18 5' 6.5" (1.689 m)    Weight:   Wt Readings from Last 1 Encounters:  01/14/18 71.4 kg    Ideal Body Weight:  65 kg  BMI:  Body mass index is 25.02 kg/m.  Estimated Nutritional Needs:   Kcal:  1450-1725 (20-25kcal/kg)  Protein:  79-107g (1.1-1.5g/kg)  Fluid:  1.5L    Lajuan Lines, RD, LDN  After Hours/Weekend Pager: [phone]

## 2018-01-14 NOTE — Progress Notes (Signed)
Pharmacy Antibiotic Note  Larry Alexander is a 84 y.o. male admitted on 01/13/2018 with sepsis.  Pharmacy has been consulted for vanc/cefepime dosing. Patient received vanc 1g, cefepime 2g, and flagyl 500 mg IV x 1 in ED.  Plan: Will continue vanc 1g IV q24h w/ 8 hour stack  Will draw vanc trough 09/27 @ 1000 prior to 4th dose. Will continue cefepime 1g IV q12h  Ke 0.0241 T1/2 ~ 24 hrs Goal trough 15 - 20 mcg/mL  Height: 5' 6.5" (168.9 cm) Weight: 157 lb 6.4 oz (71.4 kg) IBW/kg (Calculated) : 64.95  Temp (24hrs), Avg:100.6 F (38.1 C), Min:98.9 F (37.2 C), Max:102.3 F (39.1 C)  Recent Labs  Lab 01/10/18 1445 01/13/18 2219 01/13/18 2221 01/14/18 0045  WBC 6.2 9.7  --   --   CREATININE 1.13 1.28*  --   --   LATICACIDVEN  --   --  2.4* 1.6    Estimated Creatinine Clearance: 30.3 mL/min (A) (by C-G formula based on SCr of 1.28 mg/dL (H)).    Allergies  Allergen Reactions  . Contrast Media [Iodinated Diagnostic Agents]     Oral and IV dye    Thank you for allowing pharmacy to be a part of this patient's care.  Tobie Lords, PharmD, BCPS Clinical Pharmacist 01/14/2018

## 2018-01-14 NOTE — Telephone Encounter (Signed)
Patient scheduled for Bone scan today, but is in the hospital and this test cannot be done as an inpatient and it needs to be rescheduled

## 2018-01-14 NOTE — ED Notes (Signed)
Unstable critical patient in room adjacent, 1:1 care needed, staff numbers unable to support department needs causing treatment to be delayed.

## 2018-01-14 NOTE — Progress Notes (Signed)
CODE SEPSIS - PHARMACY COMMUNICATION  **Broad Spectrum Antibiotics should be administered within 1 hour of Sepsis diagnosis**  Time Code Sepsis Called/Page Received: 2155  Antibiotics Ordered: vanc/cefepime  Time of 1st antibiotic administration: 0033  Additional action taken by pharmacy:   If necessary, Name of Provider/Nurse Contacted:     Tobie Lords ,PharmD Clinical Pharmacist  01/14/2018  12:45 AM

## 2018-01-14 NOTE — Progress Notes (Signed)
The patient feels better but still has generalized weakness. Vital signs reviewed.  Physical examination done. The patient has some rash on bilateral lower extremities, which is new about 1 to 2 days per his son. Sepsis ([place]) -unclear source of infection, on broad-spectrum antibiotic.  Follow-up blood cultures. Lactic acidosis.  Improved. Acute renal failure due to dehydration.  Improving with IV fluid support.   Essential hypertension.  Hold home meds due to low side blood pressure.   CAD (coronary artery disease) -continue home dose cardiac medications   COPD (chronic obstructive pulmonary disease) ([facility]) -home dose inhalers   Hyperlipidemia -Home dose antilipid Rash.  Unclear etiology.  No recent new medication. Generalized weakness.  PT evaluation.  I discussed with the patient and his son.  Discussed with RN. Time spent about 27 minutes.

## 2018-01-14 NOTE — Telephone Encounter (Signed)
  Bone scan to be rescheduled after discharge from the hospital.  Larry Alexander

## 2018-01-14 NOTE — Care Management Note (Signed)
Case Management Note  Patient Details  Name: Larry Alexander MRN: [id number] Date of Birth: October 13, 1917  Subjective/Objective:       Patient from home with sepsis.  IV antibiotics.  Son and daughter in law live with him and care for him.  No difficulty obtaining medications.  No issues with transportation or housing.  Current with PCP.  No services in the home currently.  Has a wheelchair, cane and walker at home.  PT evaluation is pending.  Son states he does not feel he needs services but would like to see how he does with PT.               Action/Plan:   Expected Discharge Date:                  Expected Discharge Plan:  [place]  In-House Referral:     Discharge planning Services  CM Consult  Post Acute Care Choice:    Choice offered to:     DME Arranged:    DME Agency:     HH Arranged:    Reserve Agency:     Status of Service:  In process, will continue to follow  If discussed at Long Length of Stay Meetings, dates discussed:    Additional Comments:  Elza Rafter, RN 01/14/2018, 4:20 PM

## 2018-01-15 DIAGNOSIS — Z66 Do not resuscitate: Secondary | ICD-10-CM

## 2018-01-15 DIAGNOSIS — R531 Weakness: Secondary | ICD-10-CM

## 2018-01-15 DIAGNOSIS — Z515 Encounter for palliative care: Secondary | ICD-10-CM

## 2018-01-15 LAB — BASIC METABOLIC PANEL
Anion gap: 8 (ref 5–15)
BUN: 21 mg/dL (ref 8–23)
CO2: 20 mmol/L — ABNORMAL LOW (ref 22–32)
CREATININE: 1.23 mg/dL (ref 0.61–1.24)
Calcium: 7.7 mg/dL — ABNORMAL LOW (ref 8.9–10.3)
Chloride: 110 mmol/L (ref 98–111)
GFR calc Af Amer: 55 mL/min — ABNORMAL LOW (ref >60)
GFR, EST NON AFRICAN AMERICAN: 47 mL/min — AB (ref >60)
Glucose, Bld: 92 mg/dL (ref 70–99)
Potassium: 3.8 mmol/L (ref 3.5–5.1)
SODIUM: 138 mmol/L (ref 135–145)

## 2018-01-15 LAB — URINE CULTURE: CULTURE: NO GROWTH

## 2018-01-15 LAB — MAGNESIUM: Magnesium: 1.9 mg/dL (ref 1.7–2.4)

## 2018-01-15 LAB — MRSA PCR SCREENING: MRSA BY PCR: NEGATIVE

## 2018-01-15 MED ORDER — INFLUENZA VAC SPLIT HIGH-DOSE 0.5 ML IM SUSY
0.5000 mL | PREFILLED_SYRINGE | Freq: Once | INTRAMUSCULAR | Status: AC
  Administered 2018-01-15: 0.5 mL via INTRAMUSCULAR
  Filled 2018-01-15: qty 0.5

## 2018-01-15 NOTE — Evaluation (Signed)
Physical Therapy Evaluation Patient Details Name: Larry Alexander MRN: [id number] DOB: 23-Nov-1917 Today's Date: 01/15/2018   History of Present Illness  Pt is a 84 y/o M who presented with a fever, decreased appetite, and weakness.  Pt admitted with dx of sepsis.  Pt's PMH includes gout, a-fib, cancer, TAVR, CABG, COPD.       Clinical Impression  Pt admitted with above diagnosis. Pt currently with functional limitations due to the deficits listed below (see PT Problem List). Prior to onset of illness the pt was independent ambulating without AD and requiring assist from son for some of his ADLs.  Over the past two weeks he has required more assist due to fever, weakness, and not feeling well.  This date the pt demonstrated ability to stand with min guard assist and ambulate 75 ft with min guard assist.  Pt with episode of lightheadedness when ambulating, BP taken reading 115/44.  Pt's level of mobility appears to returning close to baseline and thus recommending d/c to home with HHPT and 24/7 supervision/assist.  Pt will benefit from skilled PT to increase their independence and safety with mobility to allow discharge to the venue listed below.      Follow Up Recommendations Home health PT;Supervision/Assistance - 24 hour(to address balance impairments)    Equipment Recommendations  None recommended by PT(pt has SPC and RW at home)    Recommendations for Other Services       Precautions / Restrictions Precautions Precautions: Fall(pt reports lightheadedness with amb this session) Restrictions Weight Bearing Restrictions: No      Mobility  Bed Mobility Overal bed mobility: Independent             General bed mobility comments: No physical assist or cues needed  Transfers Overall transfer level: Needs assistance Equipment used: None Transfers: Sit to/from Stand Sit to Stand: Min assist;Min guard         General transfer comment: Pt requires min assist to steady with first  sit>stand attempt from bed (pt had not been OOB for two days per son).  Pt with increased sway but denies lightheadedness.  Pt stood from bed again x1 and from chair x1 with min guard for safety as pt with slight postural sway but no LOB.    Ambulation/Gait Ambulation/Gait assistance: Min guard Gait Distance (Feet): 75 Feet Assistive device: None Gait Pattern/deviations: Step-through pattern;Decreased stride length     General Gait Details: Min guard as pt appears slightly unsteady but no LOB.  After ambulating ~40 ft pt reports feeling lightheaded and thus had pt return to chair to sit.  BP taken: 115/44.  Lightheadeness cleared quickly.  RN notified of BP and symptoms.   Stairs            Wheelchair Mobility    Modified Rankin (Stroke Patients Only)       Balance Overall balance assessment: History of Falls;Needs assistance Sitting-balance support: No upper extremity supported;Feet supported Sitting balance-Leahy Scale: Good     Standing balance support: No upper extremity supported;During functional activity Standing balance-Leahy Scale: Fair                               Pertinent Vitals/Pain Pain Assessment: No/denies pain    Home Living Family/patient expects to be discharged to:: Private residence Living Arrangements: Children(son and daughter in law (who are pt's caregivers)) Available Help at Discharge: Family;Available 24 hours/day Type of Home: House Home Access: Stairs  to enter Entrance Stairs-Rails: Chemical engineer of Steps: 5 Home Layout: One level Home Equipment: Walker - standard;Cane - single point;Shower seat;Wheelchair - manual;Hand held shower head;Grab bars - tub/shower      Prior Function Level of Independence: Needs assistance   Gait / Transfers Assistance Needed: Pt ambulating without AD, 1 fall in the past 6 months.  Still mowing lawn (riding mower).    ADL's / Homemaking Assistance Needed: Assist to wipe  with toileting, pt dresses independently, assist from son with tub transfer as pt enjoys taking a bath.   Comments: Information above is reflective of pt's PLOF prior to becoming sick.  Pt has required more assist since becoming sick ~2 wks PTA (sponge bathing, requiring more assist with toileting due to accidents)      Hand Dominance   Dominant Hand: Right    Extremity/Trunk Assessment   Upper Extremity Assessment Upper Extremity Assessment: Overall WFL for tasks assessed    Lower Extremity Assessment Lower Extremity Assessment: Overall WFL for tasks assessed       Communication   Communication: HOH  Cognition Arousal/Alertness: Awake/alert Behavior During Therapy: WFL for tasks assessed/performed Overall Cognitive Status: History of cognitive impairments - at baseline                                        General Comments General comments (skin integrity, edema, etc.): Discussed with son that the safest option for hygeine will be using the walk in shower with the hand held shower head and shower seat with assist rather than performing tub/shower transfer.      Exercises     Assessment/Plan    PT Assessment Patient needs continued PT services  PT Problem List Decreased balance;Decreased safety awareness       PT Treatment Interventions DME instruction;Gait training;Stair training;Functional mobility training;Therapeutic activities;Therapeutic exercise;Balance training;Neuromuscular re-education;Cognitive remediation;Patient/family education    PT Goals (Current goals can be found in the Care Plan section)  Acute Rehab PT Goals Patient Stated Goal: per son: to return to PLOF PT Goal Formulation: With family Time For Goal Achievement: 01/29/18 Potential to Achieve Goals: Good    Frequency Min 2X/week   Barriers to discharge        Co-evaluation               AM-PAC PT "6 Clicks" Daily Activity  Outcome Measure Difficulty turning over  in bed (including adjusting bedclothes, sheets and blankets)?: None Difficulty moving from lying on back to sitting on the side of the bed? : None Difficulty sitting down on and standing up from a chair with arms (e.g., wheelchair, bedside commode, etc,.)?: Unable Help needed moving to and from a bed to chair (including a wheelchair)?: A Little Help needed walking in hospital room?: A Little Help needed climbing 3-5 steps with a railing? : A Little 6 Click Score: 18    End of Session Equipment Utilized During Treatment: Gait belt Activity Tolerance: Patient tolerated treatment well;Other (comment)(limited by 1 episode of lightheadedness) Patient left: in chair;with call bell/phone within reach;with chair alarm set;with family/visitor present Nurse Communication: Mobility status;Other (comment)(pt's lightheadedness and BP reading) PT Visit Diagnosis: Unsteadiness on feet (R26.81);History of falling (Z91.81)    Time: 1132-1204 PT Time Calculation (min) (ACUTE ONLY): 32 min   Charges:   PT Evaluation $PT Eval Low Complexity: 1 Low PT Treatments $Gait Training: 8-22 mins  Collie Siad PT, DPT 01/15/2018, 1:58 PM

## 2018-01-15 NOTE — Discharge Instructions (Signed)
Aspiration and fall precaution. °

## 2018-01-15 NOTE — Consult Note (Signed)
Consultation Note Date: 01/15/2018   Patient Name: Larry Alexander  DOB: Jul 31, 1917  MRN: [id number]  Age / Sex: 84 y.o., male  PCP: Juluis Pitch, MD Referring Physician: Demetrios Loll, MD  Reason for Consultation: Establishing goals of care and Psychosocial/spiritual support  HPI/Patient Profile: 84 y.o. male   admitted on 01/13/2018 with reported fever.   Patient has had decreased appetite for the past couple of days.  Today he developed a fever and was significantly weaker than normal.  He was brought by his son to the ED for evaluation.   Admitted for further work-up and treatment.  According to his son patient has had continued physical and functional decline over the past several weeks, with 10 lb weigh loss.    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  Patient and family face treatment option decisions, advanced directive decisions and anticipatory care needs.   Clinical Assessment and Goals of Care:  This NP Wadie Lessen reviewed medical records, received report from team, assessed the patient and then meet at the patient's bedside along with his son/Barry  to discuss diagnosis, prognosis, GOC, EOL wishes disposition and options.  Concept of Hospice and Palliative Care were discussed  A detailed discussion was had today regarding advanced directives.  Concepts specific to code status, artifical feeding and hydration, continued IV antibiotics and rehospitalization was had.  The difference between a aggressive medical intervention path  and a palliative comfort care path for this patient at this time was had.  Values and goals of care important to patient and family were attempted to be elicited.  Discussed the importance of comfort, quality and dignity when making decisions  regarding attempts at life prolonging interventions at this time and the patient's life cycle.  Discussed the commonsense approach.  MOST form introduced and Hard Choices booklet left for review  Natural trajectory and expectations at EOL were discussed.  Questions and concerns addressed.   Family encouraged to call with questions or concerns.    PMT will continue to support holistically.  HCPOA    SUMMARY OF RECOMMENDATIONS    Code Status/Advance Care Planning:  DNR  Symptom Management:   Weakness: PT eval and treat  Palliative Prophylaxis:   Aspiration, Bowel Regimen, Delirium Protocol, Frequent Pain Assessment and Oral Care  Additional Recommendations (Limitations, Scope, Preferences):  Full Scope Treatment  Psycho-social/Spiritual:   Desire for further Chaplaincy support:no  Additional Recommendations: Education on Hospice   Emotional support offered.  Created space and opportunity for patient and his son to verbalize thoughts and feelings regarding the patient's current medical situation and natural life trajectory.  Prognosis:   Unable to determine-decisions for further diagnostics, treatments and life prolonging measures will affect long-term prognosis.  Discharge Planning: Recommend community-based palliative services.  Home with Home Health      Primary Diagnoses: Present on Admission: . Essential hypertension . Hyperlipidemia . CAD (coronary artery disease) . Sepsis (  [place]) . COPD (chronic obstructive pulmonary disease) ([place])   I have reviewed the medical record, interviewed the patient and family, and examined the patient. The following aspects are pertinent.  Past Medical History:  Diagnosis Date  . Acute gout   . Aortic stenosis   . Asthma   . Atrial fibrillation ([place])   . Cancer ([place])    basal cell ca face  . CHF (congestive heart failure) ([place])   . Chronic kidney disease (CKD)   . COPD (chronic obstructive pulmonary disease) ([place])   .  Coronary artery disease   . High cholesterol   . Hypertension   . Intermittent complete heart block ([place])   . S/P TAVR (transcatheter aortic valve replacement)    Social History   Socioeconomic History  . Marital status: Married    Spouse name: Not on file  . Number of children: Not on file  . Years of education: Not on file  . Highest education level: Not on file  Occupational History  . Not on file  Social Needs  . Financial resource strain: Not on file  . Food insecurity:    Worry: Not on file    Inability: Not on file  . Transportation needs:    Medical: Not on file    Non-medical: Not on file  Tobacco Use  . Smoking status: Former Research scientist (life sciences)  . Smokeless tobacco: Never Used  . Tobacco comment: 2 cigarettes every other week - socially  Substance and Sexual Activity  . Alcohol use: Not Currently  . Drug use: Never  . Sexual activity: Not on file  Lifestyle  . Physical activity:    Days per week: Not on file    Minutes per session: Not on file  . Stress: Not on file  Relationships  . Social connections:    Talks on phone: Not on file    Gets together: Not on file    Attends religious service: Not on file    Active member of club or organization: Not on file    Attends meetings of clubs or organizations: Not on file    Relationship status: Not on file  Other Topics Concern  . Not on file  Social History Narrative  . Not on file   Family History  Problem Relation Age of Onset  . Cancer Mother    Scheduled Meds: . aspirin EC  81 mg Oral Daily  . atorvastatin  20 mg Oral Daily  . clopidogrel  75 mg Oral Daily  . digoxin  0.0625 mg Oral QODAY  . enoxaparin (LOVENOX) injection  30 mg Subcutaneous Q24H  . feeding supplement (ENSURE ENLIVE)  237 mL Oral Q24H  . finasteride  5 mg Oral Daily  . Influenza vac split quadrivalent PF  0.5 mL Intramuscular Tomorrow-1000  . mometasone-formoterol  2 puff Inhalation BID  . pantoprazole  40 mg Oral Daily   Continuous  Infusions: PRN Meds:.acetaminophen **OR** acetaminophen, ondansetron **OR** ondansetron (ZOFRAN) IV Medications Prior to Admission:  Prior to Admission medications   Medication Sig Start Date End Date Taking? Authorizing Provider  acetaminophen (TYLENOL) 500 MG tablet Take 500-1,000 mg by mouth every 6 (six) hours as needed for mild pain or moderate pain.   Yes [provider]  albuterol (PROVENTIL HFA;VENTOLIN HFA) 108 (90 Base) MCG/ACT inhaler Inhale 2 puffs into the lungs every 6 (six) hours as needed for wheezing or shortness of breath.   Yes [provider]  aspirin EC 81 MG tablet Take 81 mg  by mouth daily.   Yes [provider]  atorvastatin (LIPITOR) 20 MG tablet Take 20 mg by mouth daily.   Yes [provider]  clopidogrel (PLAVIX) 75 MG tablet Take 75 mg by mouth daily.    Yes [provider]  digoxin (LANOXIN) 0.125 MG tablet Take 0.0625 mg by mouth every other day.   Yes [provider]  diphenhydrAMINE (BENADRYL) 25 MG tablet Take 25-50 mg by mouth at bedtime as needed for itching.    Yes [provider]  finasteride (PROSCAR) 5 MG tablet Take 5 mg by mouth daily.   Yes [provider]  Fluticasone-Salmeterol (ADVAIR) 100-50 MCG/DOSE AEPB Inhale 1 puff into the lungs 2 (two) times daily.   Yes [provider]  hydrocortisone cream 1 % Apply 1 application topically as needed for itching.    Yes [provider]  metoprolol tartrate (LOPRESSOR) 25 MG tablet Take 25 mg by mouth 2 (two) times daily.   Yes [provider]  omeprazole (PRILOSEC) 20 MG capsule Take 20 mg by mouth daily.   Yes [provider]  polyethylene glycol (MIRALAX / GLYCOLAX) packet Take 17 g by mouth daily as needed for mild constipation or moderate constipation.   Yes [provider]  tamsulosin (FLOMAX) 0.4 MG CAPS capsule Take 0.4 mg by mouth every evening.    Yes [provider]    Allergies  Allergen Reactions  . Contrast Media [Iodinated Diagnostic Agents]     Oral and IV dye   Review of Systems  Neurological: Positive for weakness.    Physical Exam  Constitutional: He appears well-developed.  -elderly frail  Cardiovascular: Normal rate, regular rhythm and normal heart sounds.  Pulmonary/Chest: He has decreased breath sounds.  Skin: Skin is warm and dry.    Vital Signs: BP (!) 115/42 (BP Location: Left Arm)   Pulse 91   Temp 99.2 F (37.3 C) (Oral)   Resp 18   Ht 5' 6.5" (1.689 m)   Wt 71.4 kg   SpO2 96%   BMI 25.02 kg/m  Pain Scale: 0-10   Pain Score: 0-No pain   SpO2: SpO2: 96 % O2 Device:SpO2: 96 % O2 Flow Rate: .   IO: Intake/output summary:   Intake/Output Summary (Last 24 hours) at 01/15/2018 1141 Last data filed at 01/15/2018 0900 Gross per 24 hour  Intake 1980.39 ml  Output 0 ml  Net 1980.39 ml    LBM: Last BM Date: 01/12/18 Baseline Weight: Weight: 68 kg Most recent weight: Weight: 71.4 kg     Palliative Assessment/Data: 50%   Discussed with Dr. Geryl Councilman  Time In: 0900 Time Out: 1015 Time Total: 75 minutes Greater than 50%  of this time was spent counseling and coordinating care related to the above assessment and plan.  Signed by: Wadie Lessen, NP   Please contact Palliative Medicine Team phone at [phone] for questions and concerns.  For individual provider: See Shea Evans

## 2018-01-15 NOTE — Care Management Note (Signed)
Case Management Note  Patient Details  Name: Larry Alexander MRN: [id number] Date of Birth: [birth year]   Patient to discharge today.  PT has evaluated patient and recommended home health PT.  Son at bedside agreeable, does not have preference of agency.  Referral made to [facility] with [place].  RW to be delivered to room prior to discharge.  Son to transport.  RNCM signing off.  Subjective/Objective:                    Action/Plan:   Expected Discharge Date:  01/15/18               Expected Discharge Plan:  [place]  In-House Referral:     Discharge planning Services  CM Consult  Post Acute Care Choice:  Durable Medical Equipment, Home Health Choice offered to:  Adult Children  DME Arranged:  [place] rolling DME Agency:  [place] Arranged:  RN, PT [facility] Agency:  [place]  Status of Service:  Completed, signed off  If discussed at [place] of Stay Meetings, dates discussed:    Additional Comments:  Beverly Sessions, RN 01/15/2018, 1:50 PM

## 2018-01-15 NOTE — Discharge Summary (Signed)
[facility] [place] NAME: Larry Alexander    MR#:  [id number]  DATE OF BIRTH:  [birth year]  DATE OF ADMISSION:  01/13/2018   ADMITTING PHYSICIAN: Kathrynn Ducking, MD  DATE OF DISCHARGE: 01/15/2018 PRIMARY CARE PHYSICIAN: Juluis Pitch, MD   ADMISSION DIAGNOSIS:  Sepsis, due to unspecified organism [facility]) [A41.9] DISCHARGE DIAGNOSIS:  Principal Problem:   Sepsis ([place]) Active Problems:   Hyperlipidemia   Essential hypertension   CAD (coronary artery disease)   COPD (chronic obstructive pulmonary disease) ([place])  SECONDARY DIAGNOSIS:   Past Medical History:  Diagnosis Date  . Acute gout   . Aortic stenosis   . Asthma   . Atrial fibrillation ([place])   . Cancer ([place])    basal cell ca face  . CHF (congestive heart failure) ([place])   . Chronic kidney disease (CKD)   . COPD (chronic obstructive pulmonary disease) ([place])   . Coronary artery disease   . High cholesterol   . Hypertension   . Intermittent complete heart block ([place])   . S/P TAVR (transcatheter aortic valve replacement)    HOSPITAL COURSE:  The patient feels better but still has generalized weakness. Sepsis ([place]) -unclear source of infection, He is on broad-spectrum antibiotic.    Chest x-ray is clear, urinalysis is normal, blood cultures negative so far.  Discontinued antibiotics.  Lactic acidosis.  Improved.  Acute renal failure due to dehydration.  Improvrd with IV fluid support. Essential hypertension.  Hold home meds due to low side blood pressure. CAD (coronary artery disease) -continue home dose cardiac medications COPD (chronic obstructive pulmonary disease) ([facility]) -home dose inhalers Hyperlipidemia -Home dose antilipid Rash.  Unclear etiology.  No recent new medication.  Improving.  Follow-up with PCP as outpatient. Generalized weakness.  PT evaluation: Home PT.  DISCHARGE CONDITIONS:  Stable, discharge to home with home health and PT today. CONSULTS  OBTAINED:   DRUG ALLERGIES:   Allergies  Allergen Reactions  . Contrast Media [Iodinated Diagnostic Agents]     Oral and IV dye   DISCHARGE MEDICATIONS:   Allergies as of 01/15/2018      Reactions   Contrast Media [iodinated Diagnostic Agents]    Oral and IV dye      Medication List    STOP taking these medications   metoprolol tartrate 25 MG tablet Commonly known as:  LOPRESSOR   tamsulosin 0.4 MG Caps capsule Commonly known as:  FLOMAX     TAKE these medications   acetaminophen 500 MG tablet Commonly known as:  TYLENOL Take 500-1,000 mg by mouth every 6 (six) hours as needed for mild pain or moderate pain.   albuterol 108 (90 Base) MCG/ACT inhaler Commonly known as:  PROVENTIL HFA;VENTOLIN HFA Inhale 2 puffs into the lungs every 6 (six) hours as needed for wheezing or shortness of breath.   aspirin EC 81 MG tablet Take 81 mg by mouth daily.   atorvastatin 20 MG tablet Commonly known as:  LIPITOR Take 20 mg by mouth daily.   clopidogrel 75 MG tablet Commonly known as:  PLAVIX Take 75 mg by mouth daily.   digoxin 0.125 MG tablet Commonly known as:  LANOXIN Take 0.0625 mg by mouth every other day.   diphenhydrAMINE 25 MG tablet Commonly known as:  BENADRYL Take 25-50 mg by mouth at bedtime as needed for itching.   finasteride 5 MG tablet Commonly known as:  PROSCAR Take 5 mg by mouth daily.   Fluticasone-Salmeterol 100-50  MCG/DOSE Aepb Commonly known as:  ADVAIR Inhale 1 puff into the lungs 2 (two) times daily.   hydrocortisone cream 1 % Apply 1 application topically as needed for itching.   omeprazole 20 MG capsule Commonly known as:  PRILOSEC Take 20 mg by mouth daily.   polyethylene glycol packet Commonly known as:  MIRALAX / GLYCOLAX Take 17 g by mouth daily as needed for mild constipation or moderate constipation.            Durable Medical Equipment  (From admission, onward)         Start     Ordered   01/15/18 1315  For home  use only DME Walker rolling  Once    Question:  Patient needs a walker to treat with the following condition  Answer:  Weakness   01/15/18 1314           DISCHARGE INSTRUCTIONS:  See AVS. If you experience worsening of your admission symptoms, develop shortness of breath, life threatening emergency, suicidal or homicidal thoughts you must seek medical attention immediately by calling 911 or calling your MD immediately  if symptoms less severe.  You Must read complete instructions/literature along with all the possible adverse reactions/side effects for all the Medicines you take and that have been prescribed to you. Take any new Medicines after you have completely understood and accpet all the possible adverse reactions/side effects.   Please note  You were cared for by a hospitalist during your hospital stay. If you have any questions about your discharge medications or the care you received while you were in the hospital after you are discharged, you can call the unit and asked to speak with the hospitalist on call if the hospitalist that took care of you is not available. Once you are discharged, your primary care physician will handle any further medical issues. Please note that NO REFILLS for any discharge medications will be authorized once you are discharged, as it is imperative that you return to your primary care physician (or establish a relationship with a primary care physician if you do not have one) for your aftercare needs so that they can reassess your need for medications and monitor your lab values.    On the day of Discharge:  VITAL SIGNS:  Blood pressure (!) 115/42, pulse 91, temperature 99.2 F (37.3 C), temperature source Oral, resp. rate 18, height 5' 6.5" (1.689 m), weight 71.4 kg, SpO2 96 %. PHYSICAL EXAMINATION:  GENERAL:  84 y.o.-year-old patient lying in the bed with no acute distress.  EYES: Pupils equal, round, reactive to light and accommodation. No scleral  icterus. Extraocular muscles intact.  HEENT: Head atraumatic, normocephalic. Oropharynx and nasopharynx clear.  NECK:  Supple, no jugular venous distention. No thyroid enlargement, no tenderness.  LUNGS: Normal breath sounds bilaterally, no wheezing, rales,rhonchi or crepitation. No use of accessory muscles of respiration.  CARDIOVASCULAR: S1, S2 normal. No murmurs, rubs, or gallops.  ABDOMEN: Soft, non-tender, non-distended. Bowel sounds present. No organomegaly or mass.  EXTREMITIES: No pedal edema, cyanosis, or clubbing.  NEUROLOGIC: Cranial nerves II through XII are intact. Muscle strength 4/5 in all extremities. Sensation intact. Gait not checked.  PSYCHIATRIC: The patient is alert and oriented x 2-3.  SKIN: No obvious rash, lesion, or ulcer.  DATA REVIEW:   CBC Recent Labs  Lab 01/14/18 0416  WBC 9.6  HGB 10.9*  HCT 31.1*  PLT 107*    Chemistries  Recent Labs  Lab 01/13/18 2219  01/15/18 0440  NA 135   < > 138  K 4.0   < > 3.8  CL 104   < > 110  CO2 23   < > 20*  GLUCOSE 109*   < > 92  BUN 24*   < > 21  CREATININE 1.28*   < > 1.23  CALCIUM 8.5*   < > 7.7*  MG  --   --  1.9  AST 29  --   --   ALT 19  --   --   ALKPHOS 96  --   --   BILITOT 1.2  --   --    < > = values in this interval not displayed.     Microbiology Results  Results for orders placed or performed during the hospital encounter of 01/13/18  Blood Culture (routine x 2)     Status: None (Preliminary result)   Collection Time: 01/13/18  9:49 PM  Result Value Ref Range Status   Specimen Description BLOOD LEFT ANTECUBITAL  Final   Special Requests   Final    BOTTLES DRAWN AEROBIC AND ANAEROBIC Blood Culture adequate volume   Culture   Final    NO GROWTH 2 DAYS Performed at [facility], [address], [place], [place] [address]    Report Status PENDING  Incomplete  Blood Culture (routine x 2)     Status: None (Preliminary result)   Collection Time: 01/13/18 10:21 PM  Result Value  Ref Range Status   Specimen Description BLOOD BLOOD LEFT FOREARM  Final   Special Requests   Final    BOTTLES DRAWN AEROBIC AND ANAEROBIC Blood Culture results may not be optimal due to an inadequate volume of blood received in culture bottles   Culture   Final    NO GROWTH 2 DAYS Performed at [facility], [address], [place], [place] [address]    Report Status PENDING  Incomplete  Urine culture     Status: None   Collection Time: 01/13/18 10:21 PM  Result Value Ref Range Status   Specimen Description   Final    URINE, RANDOM Performed at [facility], [address] [place], [place] [address]    Special Requests   Final    NONE Performed at [facility], [address] [place], [place] [address]    Culture   Final    NO GROWTH Performed at [facility], [place] [address] [place], [place] [address]    Report Status 01/15/2018 FINAL  Final  MRSA PCR Screening     Status: None   Collection Time: 01/14/18  7:17 AM  Result Value Ref Range Status   MRSA by PCR NEGATIVE NEGATIVE Final    Comment:        The GeneXpert MRSA Assay (FDA approved for NASAL specimens only), is one component of a comprehensive MRSA colonization surveillance program. It is not intended to diagnose MRSA infection nor to guide or monitor treatment for MRSA infections. Performed at [facility], [address], [place], [place] [address]     RADIOLOGY:  No results found.   Management plans discussed with the patient, his son and they are in agreement.  CODE STATUS: DNR   TOTAL TIME TAKING CARE OF THIS PATIENT: 35 minutes.    Demetrios Loll M.D on 01/15/2018 at 3:44 PM  Between 7am to 6pm - Pager - [phone]  After 6pm go to [email or web address] - [employer]  Hospitalists  Office  [phone]  CC: Primary care physician; Juluis Pitch, MD   Note: This dictation was prepared with Dragon dictation along with  smaller phrase technology. Any transcriptional errors that result from this process are unintentional.

## 2018-01-15 NOTE — Progress Notes (Signed)
given to pt.  Questions answered.  No distress.Pt discharged to home via wc.  Instructions given to son.

## 2018-01-16 DIAGNOSIS — R531 Weakness: Secondary | ICD-10-CM

## 2018-01-16 DIAGNOSIS — Z66 Do not resuscitate: Secondary | ICD-10-CM

## 2018-01-16 DIAGNOSIS — Z515 Encounter for palliative care: Secondary | ICD-10-CM

## 2018-01-16 NOTE — Progress Notes (Signed)
New referral for out patient Palliative to follow at home received from Palliative NP Wadie Lessen. Referral received post discharge. Patient information faxed to referral. Flo Shanks RN, BSN, [facility], hospital liaison [phone]

## 2018-01-18 LAB — CULTURE, BLOOD (ROUTINE X 2)
Culture: NO GROWTH
Culture: NO GROWTH
Special Requests: ADEQUATE

## 2018-01-20 ENCOUNTER — Telehealth: Payer: Self-pay

## 2018-01-20 ENCOUNTER — Ambulatory Visit: Payer: Medicare Other

## 2018-01-20 NOTE — Telephone Encounter (Signed)
Flagged on EMMI report for answering that a scheduled follow up doesn't apply.   Called and spoke with patient's son who reports hospice now following patient.  He expressed appreciation for follow up call and mentioned he completed the second EMMI call earlier today.  I thanked him for his time.

## 2018-01-23 ENCOUNTER — Other Ambulatory Visit: Payer: Self-pay | Admitting: Radiology

## 2018-01-24 ENCOUNTER — Other Ambulatory Visit (INDEPENDENT_AMBULATORY_CARE_PROVIDER_SITE_OTHER): Payer: Self-pay | Admitting: Nurse Practitioner

## 2018-01-24 ENCOUNTER — Telehealth (INDEPENDENT_AMBULATORY_CARE_PROVIDER_SITE_OTHER): Payer: Self-pay

## 2018-01-24 ENCOUNTER — Ambulatory Visit: Payer: Medicare Other | Admitting: Hematology and Oncology

## 2018-01-24 NOTE — Telephone Encounter (Signed)
Patient's son called to cancel the patient's angiogram with Dr. Delana Meyer. Patient's son Alvester Chou stated the patient is at hospice now, but if he does rally back he will call back.

## 2018-01-28 ENCOUNTER — Ambulatory Visit: Admit: 2018-01-28 | Payer: Medicare Other | Admitting: Vascular Surgery

## 2018-01-28 SURGERY — VISCERAL ANGIOGRAPHY
Anesthesia: Moderate Sedation

## 2018-05-05 ENCOUNTER — Telehealth: Payer: Self-pay

## 2018-05-05 NOTE — Telephone Encounter (Signed)
Copied from [facility] [id number]. Topic: General - Other >> May 05, 2018 10:24 AM Bea Graff, NT wrote: Reason for CRM: Charlotte Sanes with [facility] returning call to [place] regarding this pt. Cb#: [phone]

## 2018-05-05 NOTE — Telephone Encounter (Signed)
This note is written in error, hospice gave wrong name. Documented in correct chart. Closing note

## 2018-10-15 ENCOUNTER — Encounter (INDEPENDENT_AMBULATORY_CARE_PROVIDER_SITE_OTHER): Payer: Self-pay

## 2018-10-16 ENCOUNTER — Other Ambulatory Visit (INDEPENDENT_AMBULATORY_CARE_PROVIDER_SITE_OTHER): Payer: Self-pay | Admitting: Nurse Practitioner

## 2018-10-16 DIAGNOSIS — K551 Chronic vascular disorders of intestine: Secondary | ICD-10-CM

## 2018-10-16 MED ORDER — CLOPIDOGREL BISULFATE 75 MG PO TABS: 75.0000 mg | ORAL_TABLET | Freq: Every day | ORAL | 11 refills | Status: AC

## 2019-01-22 DEATH — deceased

## 2019-09-25 IMAGING — CT CT ABD-PELV W/ CM
1 of 3 series · 13 of 32 positions shown, 18 images · IV contrast (APPLIED)
Comparison: 03/16/2004 CT abdomen and pelvis.

CLINICAL DATA: [AGE]/o M; mid abdominal pain and distention after
eating for 3 weeks.

EXAM:
CT ABDOMEN AND PELVIS WITH CONTRAST
TECHNIQUE: Multidetector CT imaging of the abdomen and pelvis was performed
using the standard protocol following bolus administration of
intravenous contrast.
CONTRAST:  80mL OMNIPAQUE IOHEXOL 300 MG/ML  SOLN

[Series 2: axial st · axial · 0.70mm/px · z∈[-688,-272]mm · 13 of 93 slices shown, 18 images]
[im 5/93  soft-tissue]
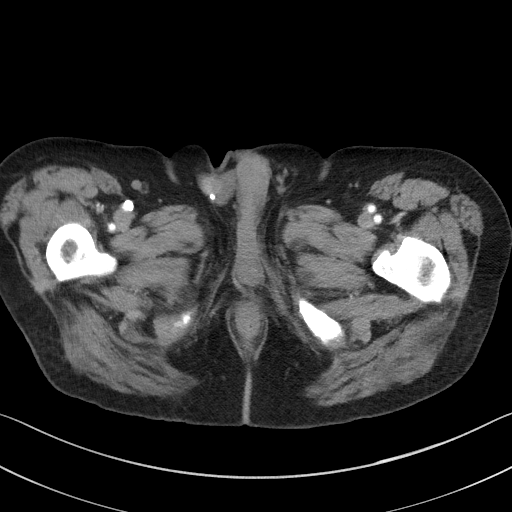
[im 5/93  bone]
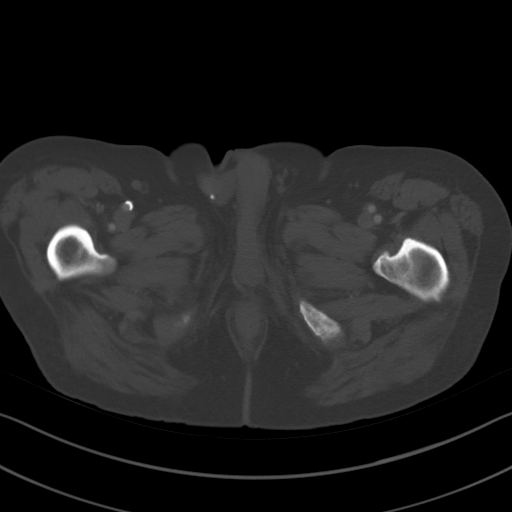
[im 14/93  soft-tissue]
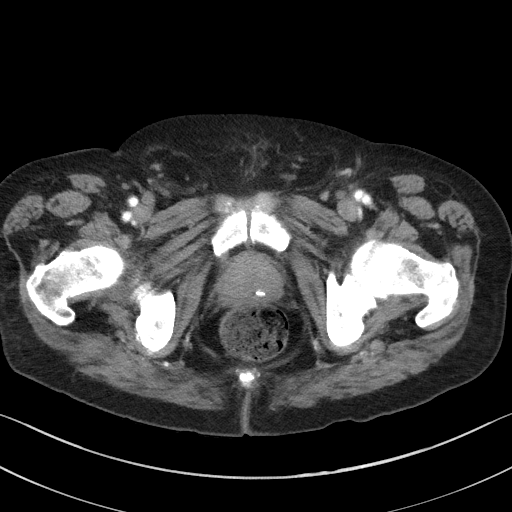
[im 19/93  soft-tissue]
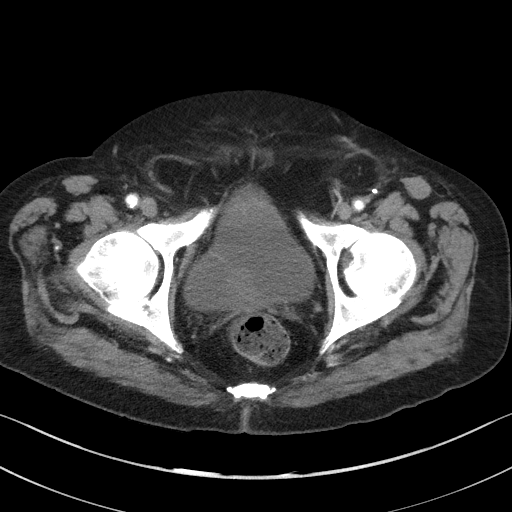
[im 28/93  soft-tissue]
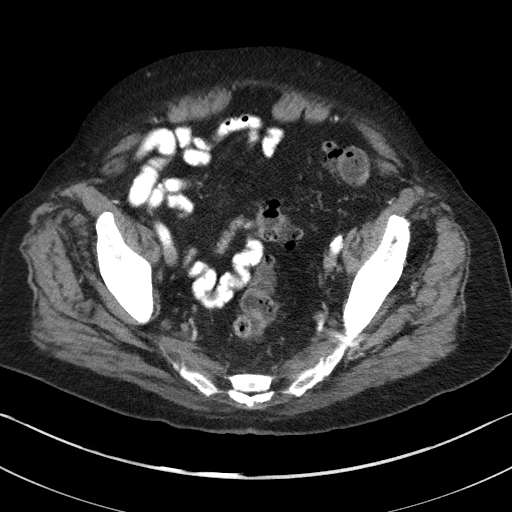
[im 37/93  soft-tissue]
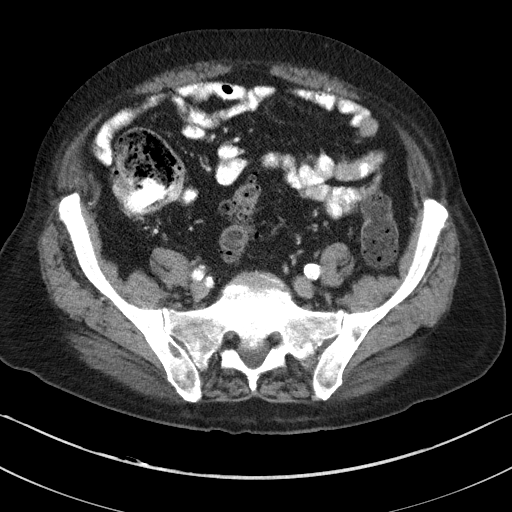
[im 42/93  soft-tissue]
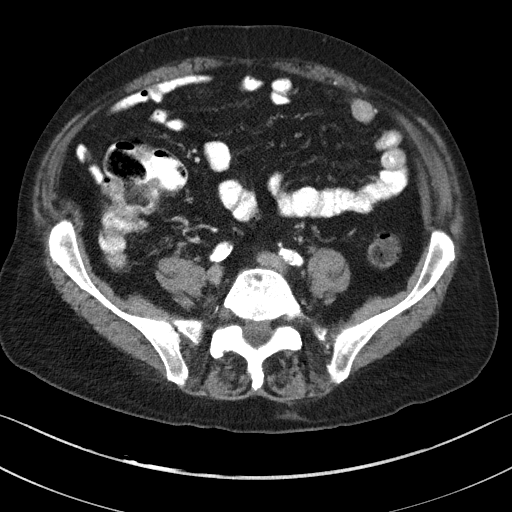
[im 51/93  soft-tissue]
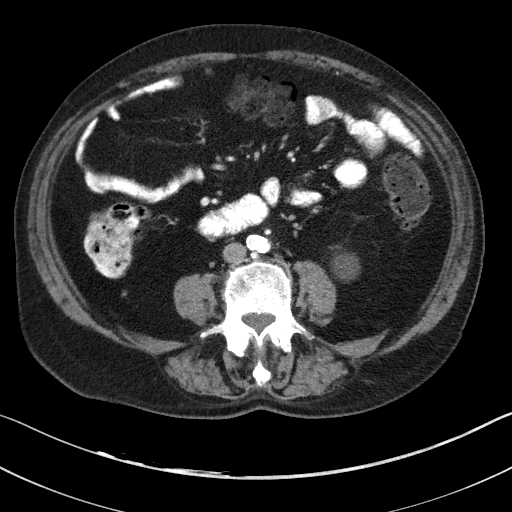
[im 56/93  soft-tissue]
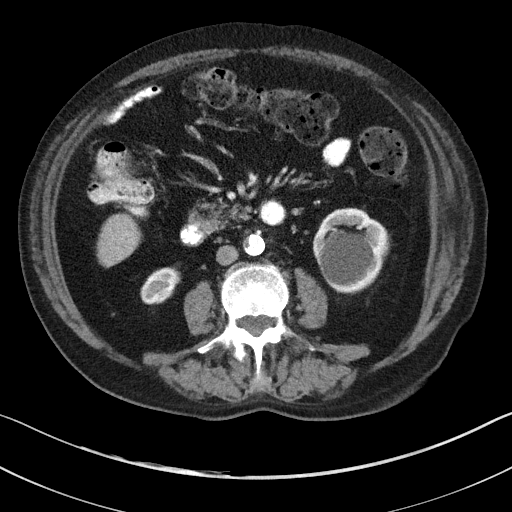
[im 65/93  soft-tissue]
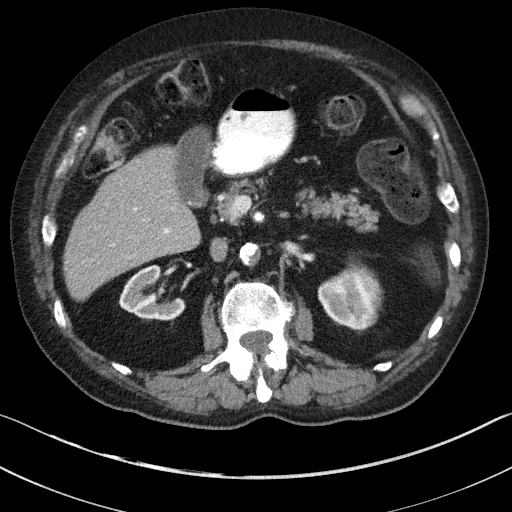
[im 65/93  bone]
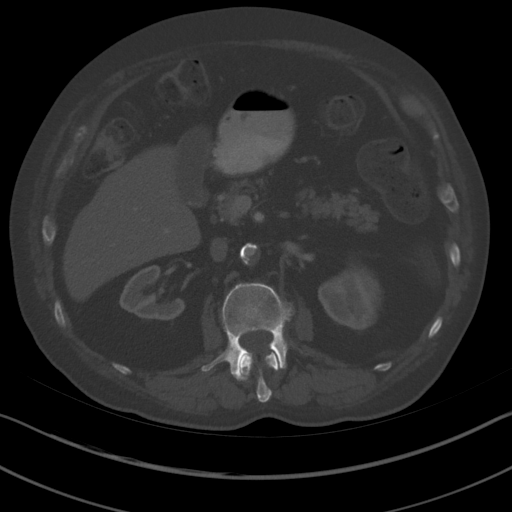
[im 74/93  soft-tissue]
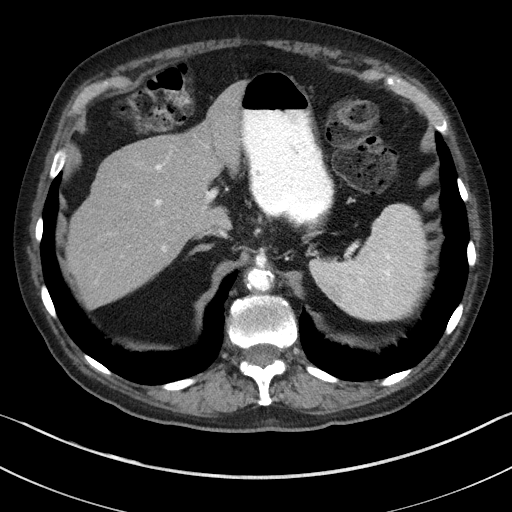
[im 74/93  lung]
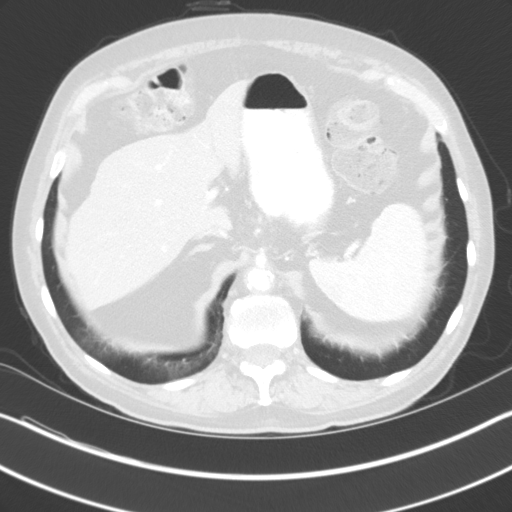
[im 79/93  soft-tissue]
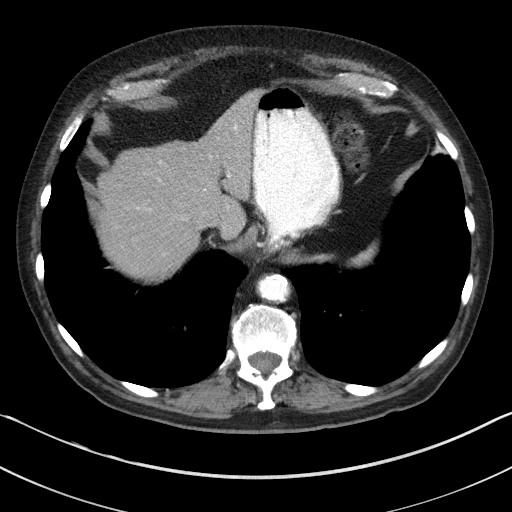
[im 79/93  lung]
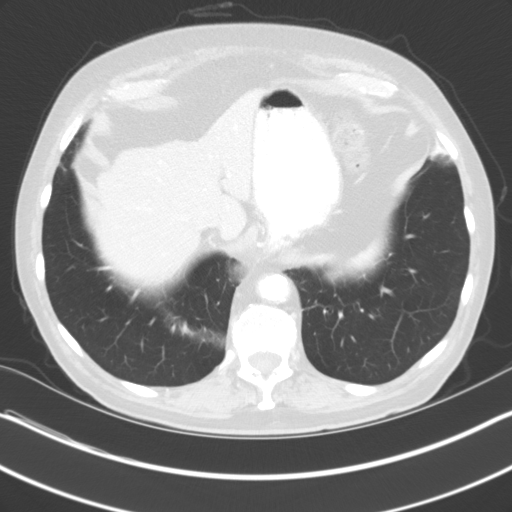
[im 83/93  lung]
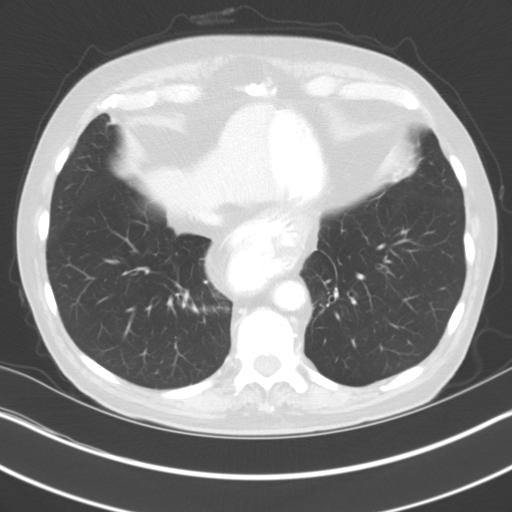
[im 88/93  soft-tissue]
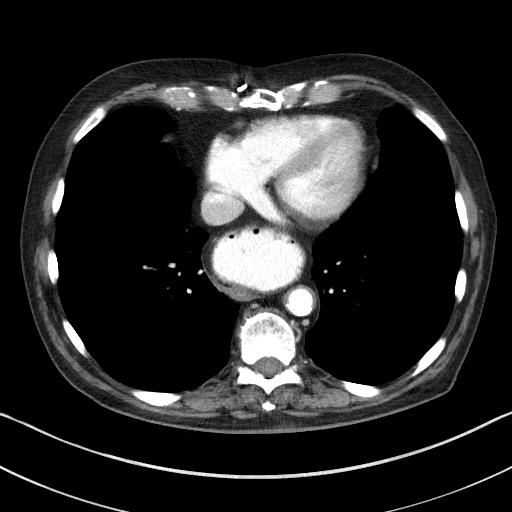
[im 88/93  lung]
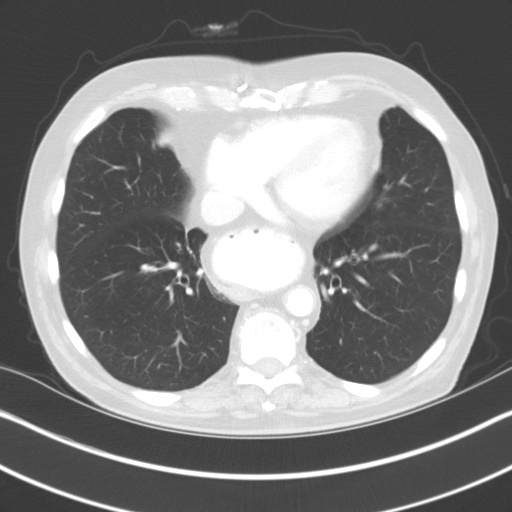

[13 of 32 positions shown; findings below may reference images not displayed]

FINDINGS: Lower chest: Moderate hiatal hernia increased in size from 0662.
Pacemaking leads in aortic stent partially visualized.

Hepatobiliary: No focal liver abnormality is seen. Cholelithiasis.
No gallbladder wall thickening. No biliary ductal dilatation.

Pancreas: Unremarkable. No pancreatic ductal dilatation or
surrounding inflammatory changes.

Spleen: Normal spleen size.

Adrenals/Urinary Tract: Adrenal glands are unremarkable. Left kidney
lower pole 3.8 cm simple appearing cyst. Otherwise kidneys are
normal, without renal calculi, focal lesion, or hydronephrosis.
Bladder is unremarkable.

Stomach/Bowel: Stomach is within normal limits. Appendix appears
normal. No evidence of bowel wall thickening, distention, or
inflammatory changes. Mild sigmoid diverticulosis without findings
of acute diverticulitis.

Vascular/Lymphatic: Severe aortic atherosclerosis. Severe stenosis
of SMA origin (series 4, image 60). No enlarged abdominal or pelvic
lymph nodes.

Reproductive: Mild prostate enlargement with calcifications.

Other: Small bilateral inguinal hernias containing fat.

Musculoskeletal: Mild S-shaped curvature of the spine. Normal lumbar
lordosis without listhesis. Numerous lucent lesions throughout the
pelvis the largest in the left ilium (series 4, image 77).
IMPRESSION: 1. Moderate hiatal hernia and creased in size from 0662.
2. Multiple lucent lesions throughout the pelvis measuring up to 16
mm in the left ilium. Findings may represent metastasis or myeloma.
Clinical correlation recommended.
3. Cholelithiasis.
4. Mild sigmoid diverticulosis without findings of acute
diverticulitis.
5. Prostate enlargement.
6. Small inguinal hernias containing fat.
7. Severe aortic atherosclerosis.  Severe stenosis of SMA origin.

By: Edzia Lindner M.D.

## 2019-10-23 IMAGING — DX DG CHEST 1V PORT
1 series · 1 of 1 positions shown · non-contrast
Comparison: Chest radiograph October 10, 2013

CLINICAL DATA: Fever, weakness.

EXAM:
PORTABLE CHEST 1 VIEW

[chest ap]
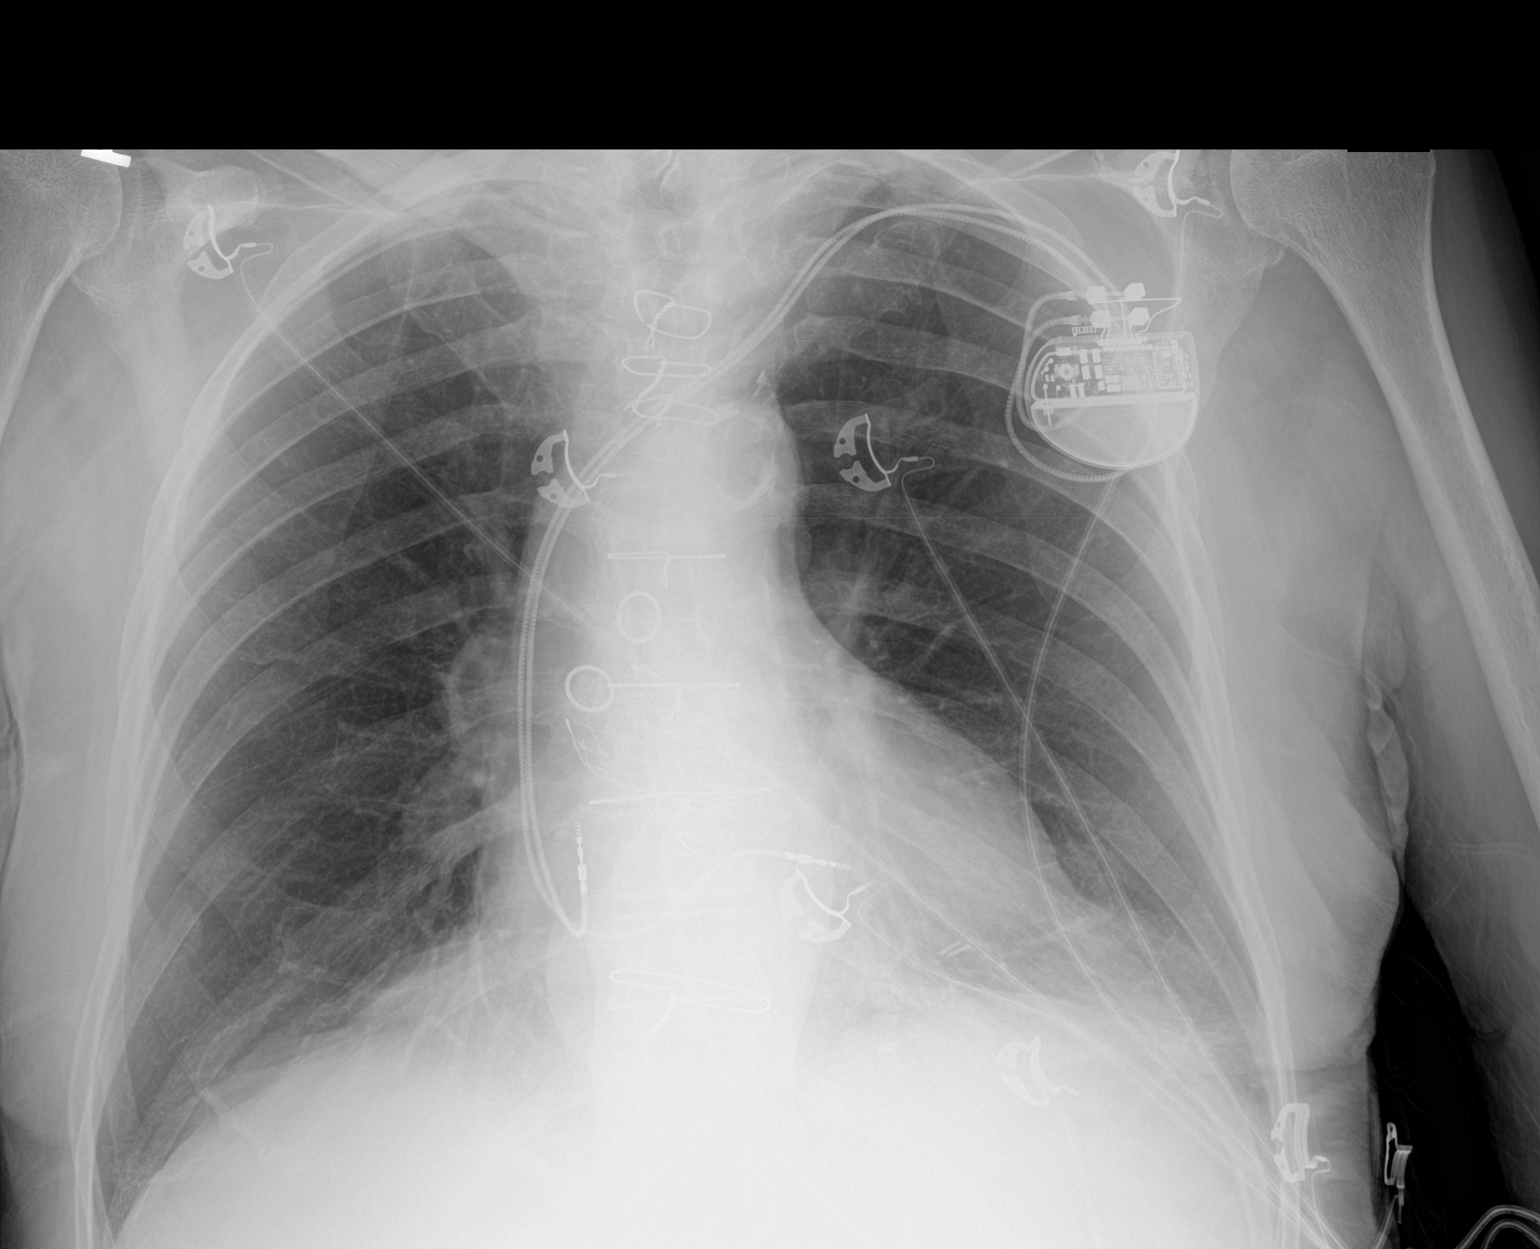

[1 of 1 positions shown; findings below may reference images not displayed]

FINDINGS: Cardiac silhouette is normal in size. Status post median sternotomy
CABG. Calcified aortic arch. Dual lead LEFT cardiac pacemaker in
situ. Bibasilar strandy densities. No pleural effusion or focal
consolidation. Nipple shadow RIGHT lung base. No pneumothorax. Soft
tissue planes and included osseous structures are non suspicious.
Small hiatal hernia.
IMPRESSION: Bibasilar atelectasis/scarring.
